# Patient Record
Sex: Female | Born: 1975 | Race: White | Hispanic: No | Marital: Married | Smoking: Former smoker
Health system: Southern US, Community
[De-identification: ages and names within clinical notes are randomized; demographics above are authoritative.]

## PROBLEM LIST (undated history)

## (undated) DIAGNOSIS — T7840XA Allergy, unspecified, initial encounter: Secondary | ICD-10-CM

## (undated) DIAGNOSIS — R002 Palpitations: Secondary | ICD-10-CM

## (undated) DIAGNOSIS — F329 Major depressive disorder, single episode, unspecified: Secondary | ICD-10-CM

## (undated) DIAGNOSIS — R001 Bradycardia, unspecified: Secondary | ICD-10-CM

## (undated) DIAGNOSIS — F32A Depression, unspecified: Secondary | ICD-10-CM

## (undated) HISTORY — PX: TONSILLECTOMY: SUR1361

## (undated) HISTORY — DX: Bradycardia, unspecified: R00.1

## (undated) HISTORY — DX: Allergy, unspecified, initial encounter: T78.40XA

## (undated) HISTORY — DX: Depression, unspecified: F32.A

## (undated) HISTORY — DX: Palpitations: R00.2

## (undated) HISTORY — DX: Major depressive disorder, single episode, unspecified: F32.9

---

## 2007-11-21 LAB — CONVERTED CEMR LAB: Pap Smear: NORMAL

## 2008-02-24 ENCOUNTER — Encounter (INDEPENDENT_AMBULATORY_CARE_PROVIDER_SITE_OTHER): Payer: Self-pay | Admitting: Obstetrics & Gynecology

## 2008-02-24 ENCOUNTER — Inpatient Hospital Stay (HOSPITAL_COMMUNITY): Admission: AD | Admit: 2008-02-24 | Discharge: 2008-02-24 | Payer: Self-pay | Admitting: Family Medicine

## 2008-09-10 ENCOUNTER — Ambulatory Visit: Payer: Self-pay | Admitting: Internal Medicine

## 2008-09-10 DIAGNOSIS — R002 Palpitations: Secondary | ICD-10-CM

## 2008-09-10 LAB — CONVERTED CEMR LAB
ALT: 15 units/L (ref 0–35)
Basophils Relative: 1.6 % (ref 0.0–3.0)
Bilirubin, Direct: 0.1 mg/dL (ref 0.0–0.3)
CO2: 28 meq/L (ref 19–32)
Calcium: 8.9 mg/dL (ref 8.4–10.5)
Creatinine, Ser: 0.5 mg/dL (ref 0.4–1.2)
Free T4: 0.6 ng/dL (ref 0.6–1.6)
Glucose, Bld: 55 mg/dL — ABNORMAL LOW (ref 70–99)
Hemoglobin: 13.1 g/dL (ref 12.0–15.0)
Lymphocytes Relative: 10 % — ABNORMAL LOW (ref 12.0–46.0)
Monocytes Relative: 1.2 % — ABNORMAL LOW (ref 3.0–12.0)
Neutro Abs: 9.3 10*3/uL — ABNORMAL HIGH (ref 1.4–7.7)
RBC: 3.97 M/uL (ref 3.87–5.11)
TSH: 2.22 microintl units/mL (ref 0.35–5.50)
Total Protein: 6.9 g/dL (ref 6.0–8.3)

## 2008-09-14 ENCOUNTER — Encounter: Payer: Self-pay | Admitting: Internal Medicine

## 2008-09-17 ENCOUNTER — Encounter: Payer: Self-pay | Admitting: Internal Medicine

## 2008-09-17 ENCOUNTER — Telehealth: Payer: Self-pay | Admitting: Internal Medicine

## 2008-09-17 ENCOUNTER — Ambulatory Visit: Payer: Self-pay

## 2008-10-02 ENCOUNTER — Telehealth: Payer: Self-pay | Admitting: Internal Medicine

## 2009-01-29 ENCOUNTER — Inpatient Hospital Stay (HOSPITAL_COMMUNITY): Admission: AD | Admit: 2009-01-29 | Discharge: 2009-01-30 | Payer: Self-pay | Admitting: Cardiology

## 2009-01-30 ENCOUNTER — Inpatient Hospital Stay (HOSPITAL_COMMUNITY): Admission: AD | Admit: 2009-01-30 | Discharge: 2009-02-01 | Payer: Self-pay | Admitting: Obstetrics and Gynecology

## 2009-07-05 ENCOUNTER — Telehealth: Payer: Self-pay | Admitting: Internal Medicine

## 2009-08-08 ENCOUNTER — Ambulatory Visit: Payer: Self-pay | Admitting: Internal Medicine

## 2009-10-14 ENCOUNTER — Ambulatory Visit: Payer: Self-pay | Admitting: Internal Medicine

## 2009-10-23 ENCOUNTER — Ambulatory Visit: Payer: Self-pay | Admitting: Interventional Radiology

## 2009-10-23 ENCOUNTER — Ambulatory Visit (HOSPITAL_BASED_OUTPATIENT_CLINIC_OR_DEPARTMENT_OTHER): Admission: RE | Admit: 2009-10-23 | Discharge: 2009-10-23 | Payer: Self-pay | Admitting: Internal Medicine

## 2009-10-24 ENCOUNTER — Telehealth: Payer: Self-pay | Admitting: Internal Medicine

## 2009-12-10 ENCOUNTER — Encounter: Payer: Self-pay | Admitting: Internal Medicine

## 2009-12-20 ENCOUNTER — Ambulatory Visit: Payer: Self-pay | Admitting: Internal Medicine

## 2010-01-13 ENCOUNTER — Ambulatory Visit: Payer: Self-pay | Admitting: Family

## 2010-01-13 LAB — CONVERTED CEMR LAB
Basophils Absolute: 0 10*3/uL (ref 0.0–0.1)
CO2: 30 meq/L (ref 19–32)
Calcium: 9.3 mg/dL (ref 8.4–10.5)
Eosinophils Absolute: 0.1 10*3/uL (ref 0.0–0.7)
GFR calc non Af Amer: 103.41 mL/min (ref >60)
HCT: 40.7 % (ref 36.0–46.0)
Lymphs Abs: 1.5 10*3/uL (ref 0.7–4.0)
MCHC: 34.4 g/dL (ref 30.0–36.0)
Monocytes Relative: 6.7 % (ref 3.0–12.0)
Platelets: 287 10*3/uL (ref 150.0–400.0)
RDW: 12.6 % (ref 11.5–14.6)
Sodium: 140 meq/L (ref 135–145)

## 2010-02-21 ENCOUNTER — Ambulatory Visit: Payer: Self-pay | Admitting: Family

## 2010-02-21 DIAGNOSIS — F40243 Fear of flying: Secondary | ICD-10-CM

## 2010-04-01 LAB — CONVERTED CEMR LAB: Pap Smear: NORMAL

## 2010-06-02 ENCOUNTER — Telehealth: Payer: Self-pay | Admitting: Internal Medicine

## 2010-06-06 ENCOUNTER — Ambulatory Visit: Payer: Self-pay | Admitting: Internal Medicine

## 2010-06-09 ENCOUNTER — Telehealth: Payer: Self-pay | Admitting: Internal Medicine

## 2010-06-13 ENCOUNTER — Telehealth: Payer: Self-pay | Admitting: Internal Medicine

## 2010-06-14 ENCOUNTER — Ambulatory Visit: Payer: Self-pay | Admitting: Family Medicine

## 2010-07-05 ENCOUNTER — Ambulatory Visit: Payer: Self-pay | Admitting: Internal Medicine

## 2010-07-09 ENCOUNTER — Ambulatory Visit: Payer: Self-pay | Admitting: Family

## 2010-09-26 ENCOUNTER — Encounter: Payer: Self-pay | Admitting: Internal Medicine

## 2010-09-26 ENCOUNTER — Ambulatory Visit: Payer: Self-pay | Admitting: Family

## 2010-09-26 DIAGNOSIS — J019 Acute sinusitis, unspecified: Secondary | ICD-10-CM | POA: Insufficient documentation

## 2010-09-26 LAB — CONVERTED CEMR LAB
Cholesterol: 175 mg/dL (ref 0–200)
Triglycerides: 66 mg/dL (ref <150)

## 2010-09-28 ENCOUNTER — Encounter: Payer: Self-pay | Admitting: Internal Medicine

## 2010-12-16 ENCOUNTER — Ambulatory Visit (INDEPENDENT_AMBULATORY_CARE_PROVIDER_SITE_OTHER)
Admission: RE | Admit: 2010-12-16 | Discharge: 2010-12-16 | Payer: PRIVATE HEALTH INSURANCE | Source: Home / Self Care | Attending: Internal Medicine | Admitting: Internal Medicine

## 2010-12-16 DIAGNOSIS — F341 Dysthymic disorder: Secondary | ICD-10-CM | POA: Insufficient documentation

## 2010-12-16 DIAGNOSIS — M25569 Pain in unspecified knee: Secondary | ICD-10-CM | POA: Insufficient documentation

## 2010-12-16 LAB — CONVERTED CEMR LAB
BUN: 18 mg/dL (ref 6–23)
Creatinine, Ser: 0.85 mg/dL (ref 0.40–1.20)
Free T4: 1.16 ng/dL (ref 0.80–1.80)
Glucose, Bld: 82 mg/dL (ref 70–99)
HCT: 40.5 % (ref 36.0–46.0)
Hemoglobin: 13.6 g/dL (ref 12.0–15.0)
MCHC: 33.6 g/dL (ref 30.0–36.0)
MCV: 90.8 fL (ref 78.0–100.0)
RBC: 4.46 M/uL (ref 3.87–5.11)
RDW: 12.7 % (ref 11.5–15.5)
TSH: 2.447 microintl units/mL (ref 0.350–4.500)

## 2010-12-16 NOTE — Assessment & Plan Note (Signed)
Summary: PINK EYE AND POSS EAR INFECTION...AS.   Vital Signs:  Patient profile:   35 year old female Weight:      167 pounds Temp:     98.3 degrees F oral Pulse rate:   72 / minute Pulse rhythm:   regular BP sitting:   100 / 58  (right arm) Cuff size:   regular  Vitals Entered By: Lowella Petties CMA (July 05, 2010 10:11 AM) CC: Itchy eyes, sore throat, ears hurting   History of Present Illness: Having some redness in eyes for 2-3 days son has conjjuctivits also given eye Rx but may be virus had left over med (cipro ointment) and started using that  Having ear and throat pain since last night can feel fluid in her ears throat is sore may have had fever---woke up in sweat  No sig cough No SOB   Allergies: 1)  ! Erythromycin  Past History:  Past medical, surgical, family and social histories (including risk factors) reviewed for relevance to current acute and chronic problems.  Past Medical History: Reviewed history from 06/06/2010 and no changes required. Depression / dysthymia        Family History: Reviewed history from 06/06/2010 and no changes required. no family hx of aneurysm no fam hx of polycystic kidney dz.     Social History: Reviewed history from 06/06/2010 and no changes required. 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  NAD Eyes:  tarsal > bulbar conjunctival injection more on right No foreign body Ears:  R ear normal and L ear normal.   Nose:  moderate inflammation and mild swelling Mouth:  no erythema, no exudates, and no lesions.   Neck:  supple, no masses, and no cervical lymphadenopathy.   Lungs:  normal respiratory effort, no intercostal retractions, no accessory muscle use, and normal breath sounds.      Impression & Recommendations:  Problem # 1:  CONJUNCTIVITIS (ICD-372.30) Assessment New  has had some crusting  sounds like she has a general viral syndrome but not sure about the eyes will use general supportive measures  sulamyd drops for eyes  Her updated medication list for this problem includes:    Sulfacetamide Sodium 10 % Soln (Sulfacetamide sodium) .Marland Kitchen... 2 drops in each four times daily for 5 days for pink eye  Complete Medication List: 1)  Sertraline Hcl 50 Mg Tabs (Sertraline hcl) .... Take 1 tablet by mouth once a day 2)  Alprazolam 0.5 Mg Tabs (Alprazolam) .... One tablet by mouth prior to flying 3)  Sulfacetamide Sodium 10 % Soln (Sulfacetamide sodium) .... 2 drops in each four times daily for 5 days for pink eye  Patient Instructions: 1)  Please schedule a follow-up appointment as needed .  Prescriptions: SULFACETAMIDE SODIUM 10 % SOLN (SULFACETAMIDE SODIUM) 2 drops in each four times daily for 5 days for pink eye  #1 x 0   Entered and Authorized by:   Cindee Salt MD   Signed by:   Cindee Salt MD on 07/05/2010   Method used:   [id number]       [address]  [place], [place]  [address]       Ph: [phone] or [phone]       Fax: [phone]   RxID:   [id number]   Prior Medications: SERTRALINE HCL 50 MG TABS (SERTRALINE HCL) Take 1 tablet by mouth once a day ALPRAZOLAM 0.5 MG TABS (ALPRAZOLAM) one tablet by mouth prior to flying Current Allergies: ! ERYTHROMYCIN

## 2010-12-16 NOTE — Assessment & Plan Note (Signed)
Summary: poss sinus infection/dt   Vital Signs:  Patient profile:   35 year old female Height:      64 inches Weight:      166.50 pounds BMI:     28.68 O2 Sat:      98 % on Room air Temp:     98.1 degrees F oral Pulse rate:   59 / minute Resp:     18 per minute BP sitting:   80 / 50  (right arm) Cuff size:   regular  Vitals Entered By: Glendell Docker CMA (September 26, 2010 8:59 AM)  O2 Flow:  Room air CC: Head congestion Is Patient Diabetic? No Pain Assessment Patient in pain? no      Comments c/o sinus pressure, dore throat, nasal draingage green in color, headache. onset Monday. Tylenol with relief of headache,saline rinse little improvement, wouldl like a rx for xanax to help with upcoming flight, would cholesterol level checked today,  fasting for labs   Primary Care Provider:  D. Thomos Lemons DO  CC:  Head congestion.  History of Present Illness: Autumn [name] is a 35 year old female who presents with a 5 day history of nasal congestion with green nasal discharge.  Denies fever, feels exhausted.  Bad sinus pressure, can't even wear her glasses.  + sore throat and bilateral ear discomfort.    Wants  her cholesterol checked.    Requesting refill on alprazolam-  Flying for thanksgiving.  Helps her fly.  f/u BP was 102/70 today in the office.  Initial BP performed by CMA was low.  Preventive Screening-Counseling & Management  Alcohol-Tobacco     Smoking Status: quit  Allergies: 1)  ! Erythromycin  Past History:  Past Medical History: Last updated: 06/06/2010 Depression / dysthymia        Review of Systems       see HPI  Physical Exam  General:  Well-developed,well-nourished,in no acute distress; alert,appropriate and cooperative throughout examination Head:  + maxillary and frontal sinus pressure Eyes:  PERRLA, sclera are clear Ears:  External ear exam shows no significant lesions or deformities.  Otoscopic examination reveals clear canals, tympanic  membranes are intact bilaterally without bulging, retraction, inflammation or discharge. Hearing is grossly normal bilaterally. Mouth:  Oral mucosa and oropharynx without lesions or exudates.  Teeth in good repair. Neck:  No deformities, masses, or tenderness noted. Lungs:  Normal respiratory effort, chest expands symmetrically. Lungs are clear to auscultation, no crackles or wheezes. Heart:  Normal rate and regular rhythm. S1 and S2 normal without gallop, murmur, click, rub or other extra sounds.   Impression & Recommendations:  Problem # 1:  SINUSITIS, ACUTE (ICD-461.9) Assessment New will treat with amoxicillin.  Her updated medication list for this problem includes:    Amoxicillin 500 Mg Caps (Amoxicillin) .Marland Kitchen... 2 caps by mouth 3  times a day x 10 days  Problem # 2:  PHOBIA, UNSPECIFIED (ICD-300.20)  Alprazolam was refilled for her upcoming flight.    Complete Medication List: 1)  Sertraline Hcl 50 Mg Tabs (Sertraline hcl) .... Take 1 tablet by mouth once a day 2)  Alprazolam 0.5 Mg Tabs (Alprazolam) .... One tablet by mouth prior to flying 3)  Amoxicillin 500 Mg Caps (Amoxicillin) .... 2 caps by mouth 3  times a day x 10 days  Other Orders: TLB-Lipid Panel (80061-LIPID)  Patient Instructions: 1)   Call if you develop fever over 101, increasing sinus pressure, pain with eye movement, increased facial tenderness of  swelling, or if you develop visual changes. 2)  Please complete your blood work on the first floor.  Prescriptions: ALPRAZOLAM 0.5 MG TABS (ALPRAZOLAM) one tablet by mouth prior to flying  #5 x 0   Entered and Authorized by:   Lemont Fillers FNP   Signed by:   Lemont Fillers FNP on 09/26/2010   Method used:   Print then Give to Patient   RxID:   [id number] AMOXICILLIN 500 MG CAPS (AMOXICILLIN) 2 caps by mouth 3  times a day x 10 days  #60 x 0   Entered and Authorized by:   Lemont Fillers FNP   Signed by:   Lemont Fillers FNP on  09/26/2010   Method used:   Print then Give to Patient   RxID:   [id number]    Orders Added: 1)  TLB-Lipid Panel [80061-LIPID] 2)  Est. Patient Level III [47425]     Current Allergies (reviewed today): ! ERYTHROMYCIN

## 2010-12-16 NOTE — Assessment & Plan Note (Signed)
Summary: ACUTE--EAR INF//VGJ   Vital Signs:  Patient profile:   35 year old female Weight:      169 pounds Temp:     98.2 degrees F oral Pulse rate:   76 / minute Pulse rhythm:   regular BP sitting:   100 / 70  (right arm) Cuff size:   regular  Vitals Entered By: Lowella Petties CMA (June 14, 2010 9:09 AM) CC: Both ears hurting, has cough and sore throat.  Taking abx.   Primary Care Provider:  Dondra Spry DO   History of Present Illness: pleasant 35 year old female presents with being sick this week, she is traveling and flying to [place] this week, she wanted to have her ears recheck. They're still feeling full and having some pain, and some sinus congestion.  She's currently on  Antibiotics. Afebrile.  REVIEW OF SYSTEMS GEN: Acute illness details above. CV: No chest pain or SOB GI: No noted N or V Otherwise, pertinent positives and negatives are noted in the HPI.   GEN: WDWN, NAD; alert,appropriate and cooperative throughout exam HEENT: Normocephalic and atraumatic. Throat clear, w/o exudate, no LAD, R TM clear, L TM - good landmarks, serous fluid present. rhinnorhea.  Left frontal and maxillary sinuses: NT Right frontal and maxillary sinuses: NT NECK: No ant or post LAD CV: RRR, No M/G/R PULM: no resp distress, no accessory muscles.  No retractions. no w/c/r ABD: S,NT,ND,+BS, No HSM EXTR: no c/c/e PSYCH: full affect, pleasant, conversant   Allergies: 1)  ! Erythromycin   Impression & Recommendations:  Problem # 1:  OTITIS MEDIA, SEROUS, ACUTE (ICD-381.01) Assessment Improved at this point, ears appear sterile, there is serous fluid present, but discussed symptomatic measures with the patient including Sudafed use, afrin, and reassured  Complete Medication List: 1)  Sertraline Hcl 50 Mg Tabs (Sertraline hcl) .... Take 1 tablet by mouth once a day 2)  Alprazolam 0.5 Mg Tabs (Alprazolam) .... One tablet by mouth prior to flying 3)  Cefuroxime Axetil 500 Mg  Tabs (Cefuroxime axetil) .... One by mouth two times a day 4)  Prednisone 20 Mg Tabs (Prednisone) .Marland Kitchen.. 1 tab by mouth two times a day x 4 days, then 1/2 by mouth two times a day x 4 days, then 1/2 by mouth once daily x 4 days

## 2010-12-16 NOTE — Letter (Signed)
   [place] [facility] [address] [place], [place]  [address]  [place] Phone: [phone]      September 28, 2010   [facility] [address] [place], [place] [address]  RE:  LAB RESULTS  Dear  [name],  The following is an interpretation of your most recent lab tests.  Please take note of any instructions provided or changes to medications that have resulted from your lab work.  LIPID PANEL:  Good - no changes needed Triglyceride: 66   Cholesterol: 175   LDL: 93   HDL: 69   Chol/HDL%:  2.5 Ratio        Sincerely Yours,    Dr. Thomos Lemons  Appended Document:  mailed

## 2010-12-16 NOTE — Progress Notes (Signed)
Summary: needs rx to be able to fly   Phone Note Call from Patient Call back at Work Phone [phone]   Caller: patient live Call For: Autumn [name]  Summary of Call: She has a flight to [place] and needs something for her flying anxiety.  [facility] [address]  Initial call taken by: Roselle Locus,  June 02, 2010 1:21 PM  Follow-up for Phone Call        see rx for alprazolam Follow-up by: D. Thomos Lemons DO,  June 02, 2010 3:12 PM  Additional Follow-up for Phone Call Additional follow up Details #1::        Rx Called In, patient informed  Additional Follow-up by: Glendell Docker CMA,  June 02, 2010 4:34 PM    Prescriptions: ALPRAZOLAM 0.5 MG TABS (ALPRAZOLAM) one tablet by mouth prior to flying  #10 x 0   Entered and Authorized by:   D. Thomos Lemons DO   Signed by:   D. Thomos Lemons DO on 06/02/2010   Method used:   Telephoned to ...       [facility]  [address] [phone]       [address]       [place], [place]  [address]       Ph: [phone] or [phone]       Fax: [phone]   RxID:   [id number]

## 2010-12-16 NOTE — Progress Notes (Signed)
Summary: Status Update  Phone Note Call from Patient Call back at Home Phone [phone]   Caller: Patient Call For: D. Thomos Lemons DO Summary of Call: patient called and left voice message stating she was seen on Friday, her ear pain has improved some, but she now has a cough, and feels bad. She would like to know if she should give the antibiotics a couple more days to work or should she come back to be evaluated Initial call taken by: Glendell Docker CMA,  June 09, 2010 11:31 AM  Follow-up for Phone Call        give it a few more days,  if not feeling better, OV Follow-up by: D. Thomos Lemons DO,  June 09, 2010 1:49 PM  Additional Follow-up for Phone Call Additional follow up Details #1::        Pt advised per Dr. Olegario Messier instructions. Nicki Guadalajara Fergerson CMA Duncan Dull)  June 09, 2010 3:22 PM

## 2010-12-16 NOTE — Assessment & Plan Note (Signed)
Summary: anxiety about a trip/mhf   Vital Signs:  Patient profile:   34 year old female Height:      64 inches Weight:      165 pounds BMI:     28.42 Temp:     98.1 degrees F oral Pulse rate:   84 / minute Pulse rhythm:   regular Resp:     16 per minute BP sitting:   108 / 60  (right arm) Cuff size:   regular  Vitals Entered By: Mervin Kung CMA (February 21, 2010 1:18 PM) CC: room 5  Pt. states she is anxious about flying. Would like something to help calm her nerves.   Primary Care Provider:  DThomos Lemons DO  CC:  room 5  Pt. states she is anxious about flying. Would like something to help calm her nerves..  History of Present Illness: Ms Hazelett is a 35 year old female who presents today with anxiety regarding her upcoming flight to [place].  She is flying directly from [place] to [facility].  She continues sertraline which she feels controls her generalized anxiety, however the last two days she has become increasingly anxious about her trip which is this evening.  She is requesting something to "calm her nerves" on the airplane.    Allergies: 1)  ! Erythromycin  Physical Exam  General:  Well-developed,well-nourished,in no acute distress; alert,appropriate and cooperative throughout examination Psych:  Cognition and judgment appear intact. Alert and cooperative with normal attention span and concentration. No apparent delusions, illusions, hallucinations   Impression & Recommendations:  Problem # 1:  PHOBIA, UNSPECIFIED (ICD-300.20) Assessment Comment Only 15 minutes spent with patient.  Greater than 50% of this time was spent counselling patient on her anxiety and fear of flying. A prescription was provided for alprazolam to be used prior to flying.    Complete Medication List: 1)  Sertraline Hcl 50 Mg Tabs (Sertraline hcl) .... Take 1 tablet by mouth once a day 2)  Alprazolam 0.5 Mg Tabs (Alprazolam) .... One tablet by mouth prior to flying  Patient  Instructions: 1)  Have a fun trip! 2)  Follow up as needed. Prescriptions: ALPRAZOLAM 0.5 MG TABS (ALPRAZOLAM) one tablet by mouth prior to flying  #4 x 0   Entered and Authorized by:   Lemont Fillers FNP   Signed by:   Lemont Fillers FNP on 02/21/2010   Method used:   Print then Give to Patient   RxID:   [id number]   Current Allergies (reviewed today): ! ERYTHROMYCIN

## 2010-12-16 NOTE — Assessment & Plan Note (Signed)
Summary: sinus infection/mhf   Vital Signs:  Patient profile:   35 year old female Height:      64 inches Weight:      172 pounds BMI:     29.63 O2 Sat:      96 % on Room air Temp:     97.9 degrees F oral Pulse rate:   62 / minute Pulse rhythm:   regular Resp:     16 per minute BP sitting:   90 / 60  (left arm) Cuff size:   regular  Vitals Entered By: Glendell Docker CMA (June 06, 2010 4:20 PM)  O2 Flow:  Room air CC: Sinus  Infection Is Patient Diabetic? No Pain Assessment Patient in pain? no        Primary Care Provider:  Dondra Spry DO  CC:  Sinus  Infection.  History of Present Illness: 35 y/o white female co bilateral ear pain and fluid build up, nasal draingage green in color, for the past 5 days. Nyquil with little relief. She denies temp  Preventive Screening-Counseling & Management  Alcohol-Tobacco     Smoking Status: quit  Allergies: 1)  ! Erythromycin  Past History:  Past Medical History: Depression / dysthymia        Family History: no family hx of aneurysm no fam hx of polycystic kidney dz.     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   Ears:  right and left tm red and retracted.  bilateral fluid behind TM Mouth:  pharyngeal erythema.   Lungs:  Normal respiratory effort, chest expands symmetrically. Lungs are clear to auscultation, no crackles or wheezes. Heart:  Normal rate and regular rhythm. S1 and S2 normal without gallop, murmur, click, rub or other extra sounds.   Impression & Recommendations:  Problem # 1:  OTITIS MEDIA, SEROUS, ACUTE (ICD-381.01) take ceftin and prednisone as directed Patient advised to call office if symptoms persist or worsen.  Complete Medication List: 1)  Sertraline Hcl 50 Mg Tabs (Sertraline  hcl) .... Take 1 tablet by mouth once a day 2)  Alprazolam 0.5 Mg Tabs (Alprazolam) .... One tablet by mouth prior to flying 3)  Cefuroxime Axetil 500 Mg Tabs (Cefuroxime axetil) .... One by mouth two times a day 4)  Prednisone 20 Mg Tabs (Prednisone) .Marland Kitchen.. 1 tab by mouth two times a day x 4 days, then 1/2 by mouth two times a day x 4 days, then 1/2 by mouth once daily x 4 days  Patient Instructions: 1)  Call our office if your symptoms do not  improve or gets worse. Prescriptions: PREDNISONE 20 MG TABS (PREDNISONE) 1 tab by mouth two times a day x 4 days, then 1/2 by mouth two times a day x 4 days, then 1/2 by mouth once daily x 4 days  #14 x 0   Entered and Authorized by:   D. Thomos Lemons DO   Signed by:   D. Thomos Lemons DO on 06/06/2010   Method used:   [facility]        [id number]       [address]       [place], [place]  [address]  Ph: [phone] or [phone]       Fax: [phone]   RxID:   [id number] CEFUROXIME AXETIL 500 MG TABS (CEFUROXIME AXETIL) one by mouth two times a day  #20 x 0   Entered and Authorized by:   D. Thomos Lemons DO   Signed by:   D. Thomos Lemons DO on 06/06/2010   Method used:   [facility]ically [facility]        [id number]       [address]       [place], [place]  [address]       Ph: [phone] or [phone]       Fax: [phone]   RxID:   [id number]   Current Allergies (reviewed today): ! ERYTHROMYCIN   Preventive Care Screening  Pap Smear:    Date:  04/01/2010    Results:  normal

## 2010-12-16 NOTE — Assessment & Plan Note (Signed)
Summary: pink eye / tf,cma--Rm 4   Vital Signs:  Patient profile:   35 year old female Height:      64 inches Weight:      167.50 pounds BMI:     28.86 Temp:     98.0 degrees F oral Pulse rate:   72 / minute Pulse rhythm:   regular Resp:     16 per minute BP sitting:   100 / 74  (right arm) Cuff size:   regular  Vitals Entered By: Mervin Kung CMA Duncan Dull) (July 09, 2010 3:50 PM) CC: Room 4   Pt seen at Saturday [facility] and given rx for pink eye. Symptoms not better. Is Patient Diabetic? No   Primary Care Provider:  Dondra Spry DO  CC:  Room 4   Pt seen at [facility] clinic and given rx for pink eye. Symptoms not better.Marland Kitchen  History of Present Illness: Ms Humbarger is a 35 year old female who presents today with complaint of pink eye. These symptoms started last thursday. Notes that her son also had conjunctivitis, but this cleared on its own without antibiotics.  She saw Dr Alphonsus Sias in the Saturday clinic and was given Rx for sulfacetamide drops.  Initially felt better on sunday, but then got bad again on Sunday.  Initially symptoms started in the right eye, now more severe in left eye.  Denies visual deficits.  No definite fever, but had chills on Friday night.  Mild nasal congestion in AM, then better in the afternoons.  Notes that eyes are "glued shut" in the AM with thick yellow discharge.   Allergies: 1)  ! Erythromycin  Past History:  Past Medical History: Last updated: 06/06/2010 Depression / dysthymia        Physical Exam  General:  Well-developed,well-nourished,in no acute distress; alert,appropriate and cooperative throughout examination Head:  Normocephalic and atraumatic without obvious abnormalities. No apparent alopecia or balding. Eyes:  vision grossly intact, +conjunctival injection bilaterally left greater than right.  No discharge or crusting noted on eyelashes. Lungs:  Normal respiratory effort, chest expands symmetrically. Lungs are clear to auscultation,  no crackles or wheezes. Heart:  Normal rate and regular rhythm. S1 and S2 normal without gallop, murmur, click, rub or other extra sounds.   Impression & Recommendations:  Problem # 1:  CONJUNCTIVITIS (ICD-372.30) Assessment Unchanged Will D/C sulfacetamide solution.  Start Ciloxan.  Etiology may be viral vs. allergic conjunctivitis.  If no improvement in next few days or if symptoms worsen will consider referral to opthalmology.  Plan discussed with pt.   Her updated medication list for this problem includes:    Sulfacetamide Sodium 10 % Soln (Sulfacetamide sodium) .Marland Kitchen... 2 drops in each four times daily for 5 days for pink eye    Ciloxan 0.3 % Soln (Ciprofloxacin hcl) .Marland Kitchen... 2 drops in each ey every 2 hours while awake x 2 days, then every 4 hours while awake for additional 5 days  Complete Medication List: 1)  Sertraline Hcl 50 Mg Tabs (Sertraline hcl) .... Take 1 tablet by mouth once a day 2)  Alprazolam 0.5 Mg Tabs (Alprazolam) .... One tablet by mouth prior to flying 3)  Sulfacetamide Sodium 10 % Soln (Sulfacetamide sodium) .... 2 drops in each four times daily for 5 days for pink eye 4)  Ciloxan 0.3 % Soln (Ciprofloxacin hcl) .... 2 drops in each ey every 2 hours while awake x 2 days, then every 4 hours while awake for additional 5 days  Patient Instructions:  1)  Go to ER if you develop problems with your vision. 2)  Call if symptoms worsen or do not improve. Prescriptions: CILOXAN 0.3 % SOLN (CIPROFLOXACIN HCL) 2 drops in each ey every 2 hours while awake x 2 days, then every 4 hours while awake for additional 5 days  #1 x 0   Entered and Authorized by:   Lemont Fillers FNP   Signed by:   Lemont Fillers FNP on 07/09/2010   Method used:   [facility]        [facility]  [address] [phone][facility]       [address] [place], [place]  [address]       Ph: [phone] or [phone]       Fax: [phone]   RxID:   [id number]   Current Allergies  (reviewed today): ! ERYTHROMYCIN

## 2010-12-16 NOTE — Letter (Signed)
   [facility] [address] [place], [place] [address]    January 13, 2010   [facility] [address] [place], [place] [address]  RE:  LAB RESULTS  Dear  Ms. Bolen,  The following is an interpretation of your most recent lab tests.  Please take note of any instructions provided or changes to medications that have resulted from your lab work.  ELECTROLYTES:  Good - no changes needed   CBC:  Good - no changes needed   Sincerely Yours,    Lemont Fillers FNP

## 2010-12-16 NOTE — Assessment & Plan Note (Signed)
Summary: Diareah- jr   Vital Signs:  Patient profile:   35 year old female Weight:      164.50 pounds BMI:     28.34 Temp:     98.2 degrees F oral Pulse rate:   76 / minute Pulse rhythm:   regular Resp:     18 per minute BP sitting:   100 / 78  (left arm) Cuff size:   regular  Vitals Entered By: Mervin Kung CMA (January 13, 2010 10:38 AM) CC: room 16  Diarrhea Comments Diarrhea since last Tuesday after n/v on Monday. N/V improved.    Primary Care Provider:  Dondra Spry DO  CC:  room 16  Diarrhea.  History of Present Illness: Autumn [name] is a 35 year old female who presents today with c/o diarrhea x 6 days.  She had nausea and vomitting on Monday 2/21 which has now improved.  Notes that she started Kaopectate yesterday previously 6-8 loose BM's a day.  Now after koapectate she is down to 3-4/day.  Patient denies fever.  Denies bloody stool.  Note's stools are darker after immodium.  Allergies: 1)  ! Erythromycin  Physical Exam  General:  Well-developed,well-nourished,in no acute distress; alert,appropriate and cooperative throughout examination Head:  Normocephalic and atraumatic without obvious abnormalities. No apparent alopecia or balding. Neck:  No deformities, masses, or tenderness noted. Lungs:  Normal respiratory effort, chest expands symmetrically. Lungs are clear to auscultation, no crackles or wheezes. Heart:  Normal rate and regular rhythm. S1 and S2 normal without gallop, murmur, click, rub or other extra sounds. Abdomen:  Bowel sounds positive,abdomen soft.  Very mild LLQ tenderness without guarding or masses, organomegaly or hernias noted. No acute abdomen.     Impression & Recommendations:  Problem # 1:  DIARRHEA, ACUTE (ICD-787.91) Assessment New Given recent history of antibiotics will check stool for C. Diff.  Will also check CBC and stool culture.  BMET to assess hydration and electrolytes.  Patient is nursing and this limits our medication  choices.   Her updated medication list for this problem includes:    Kaopectate 262 Mg/32ml Susp (Bismuth subsalicylate) .Marland Kitchen... 2 tbsp as needed  Orders: Venipuncture (25427) TLB-CBC Platelet - w/Differential (85025-CBCD) TLB-BMP (Basic Metabolic Panel-BMET) (80048-METABOL) T-Clostridium difficile Toxin A/B (06237-62831) T-Culture, Stool (87045/[id number])  Complete Medication List: 1)  Sertraline Hcl 50 Mg Tabs (Sertraline hcl) .... Take 1 tablet by mouth once a day 2)  Kaopectate 262 Mg/58ml Susp (Bismuth subsalicylate) .... 2 tbsp as needed   Patient Instructions: 1)  Please follow up in 1 week, sooner if fever, abdominal pain, worsening diarrhea or inability to keep down food or medicine. 2)  Complete stool samples and bring back ASAP.  Current Allergies (reviewed today): ! ERYTHROMYCIN

## 2010-12-16 NOTE — Progress Notes (Signed)
Summary: No Improvement  Phone Note Call from Patient Call back at Work Phone [phone]   Caller: Patient Call For: D. Thomos Lemons DO Summary of Call: patient called and left voice message at 4:45pm stating she was not feeling any better.  Call was returned to patient, she was advised per Dr Artist Pais that if she was not feeling any better, she would need to return for an office visit. She was offered to schedule with Dr Artist Pais next week, she declines and states she will be out of town next week. She was informed of Saturday Clinic at [facility] and was advised to call [phone] phone number to schedule. Initial call taken by: Glendell Docker CMA,  June 13, 2010 4:52 PM

## 2010-12-16 NOTE — Assessment & Plan Note (Signed)
Summary: congestion/mhf   Vital Signs:  Patient profile:   35 year old female Weight:      167.25 pounds BMI:     28.81 O2 Sat:      97 % on Room air Temp:     97.9 degrees F oral Pulse rate:   63 / minute Pulse rhythm:   regular Resp:     18 per minute BP sitting:   108 / 60  (right arm) Cuff size:   regular  Vitals Entered By: Glendell Docker CMA (December 20, 2009 9:12 AM)  O2 Flow:  Room air  Primary Care Provider:  D. Thomos Lemons DO  CC:  URI symptoms.  History of Present Illness:  URI Symptoms      This is a 35 year old woman who presents with URI symptoms.  The patient reports purulent nasal discharge, sore throat, productive cough, earache, and sick contacts.  The patient denies fever, stiff neck, dyspnea, wheezing, rash, and vomiting.  The patient also reports itchy watery eyes and headache.  Son has RSV.  she  notes sinus congestion and drainage x 2 weeks.  eye discharge in AM. right ear feels full and clicks  Allergies: 1)  ! Erythromycin  Past History:  Past Medical History: Depression / dysthymia       Family History: no family hx of aneurysm no fam hx of polycystic kidney dz.    Social History: Occupation:  Museum/gallery exhibitions officer for [employer] Married for 3 yrs Alcohol use-no   Previously a light smoker - Quit in 1630   28 month old infant  Physical Exam  General:  alert, well-developed, and well-nourished.   Eyes:  pupils equal, pupils round, and pupils reactive to light.  mild conjunctival injection Ears:  left TM retracted - fluid level Lungs:  normal respiratory effort and normal breath sounds.   Heart:  normal rate, regular rhythm, and no gallop.     Impression & Recommendations:  Problem # 1:  OTITIS MEDIA, SEROUS, ACUTE (ICD-381.01) Take abx and prednisone as directed.  Patient advised to call office if symptoms persist or worsen.  Complete Medication List: 1)  Multivitamins Tabs (Multiple vitamin) .... Take 1 tablet by  mouth once a day 2)  Sertraline Hcl 50 Mg Tabs (Sertraline hcl) .... Take 1 tablet by mouth once a day 3)  Amoxicillin 875 Mg Tabs (Amoxicillin) .... One by mouth two times a day 4)  Prednisone 10 Mg Tabs (Prednisone) .... 3 tabs by mouth once daily x 3 days, 2 tabs by mouth once daily x 3 days, 1 tab by mouth once daily x 3 days  Patient Instructions: 1)  Call our office if your symptoms do not  improve or gets worse. Prescriptions: PREDNISONE 10 MG TABS (PREDNISONE) 3 tabs by mouth once daily x 3 days, 2 tabs by mouth once daily x 3 days, 1 tab by mouth once daily x 3 days  #18 x 0   Entered and Authorized by:   D. Thomos Lemons DO   Signed by:   D. Thomos Lemons DO on 12/20/2009   Method used:   Electronically to        [id number] (ret[facility])       [address]       [place], [place]  [address]       Ph: [phone] or [phone]       Fax:  [phone]   RxID:   [id number] AMOXICILLIN 875 MG TABS (AMOXICILLIN) one by mouth two times a day  #20 x 0   Entered and Authorized by:   D. Thomos Lemons DO   Signed by:   D. Thomos Lemons DO on 12/20/2009   Method used:   Electronically to        [id number] ([facility])       [address]       [place], [place]  [address]       Ph: [phone] or [phone]       Fax: [phone]   RxID:   [id number]   Current Allergies (reviewed today): ! ERYTHROMYCIN

## 2010-12-16 NOTE — Consult Note (Signed)
Summary: [facility]  [facility]   Imported By: Lanelle Bal 12/19/2009 11:58:53  _____________________________________________________________________  External Attachment:    Type:   Image     Comment:   External Document

## 2010-12-18 ENCOUNTER — Encounter: Payer: Self-pay | Admitting: Internal Medicine

## 2010-12-24 NOTE — Letter (Signed)
   [facility] [address] [place], [place]  [address]  [place] Phone: [phone]      December 18, 2010   [facility] [address] [place], [place] [address]  RE:  LAB RESULTS  Dear  Ms. Ramanathan,  The following is an interpretation of your most recent lab tests.  Please take note of any instructions provided or changes to medications that have resulted from your lab work.  ELECTROLYTES:  Good - no changes needed  KIDNEY FUNCTION TESTS:  Good - no changes needed   THYROID STUDIES:  Thyroid studies normal TSH: 2.447     CBC:  Good - no changes needed       Sincerely Yours,    Dr. Thomos Lemons  Appended Document:  mailed

## 2011-01-07 NOTE — Assessment & Plan Note (Signed)
Summary: right knee pain/ss   Vital Signs:  Patient profile:   35 year old female Height:      64 inches Weight:      169 pounds BMI:     29.11 O2 Sat:      99 % on Room air Temp:     98.5 degrees F oral Pulse rate:   60 / minute Pulse rhythm:   regular Resp:     18 per minute BP sitting:   90 / 70  (right arm) Cuff size:   regular  Vitals Entered By: Glendell Docker CMA (December 16, 2010 3:23 PM)  O2 Flow:  Room air CC: Right Knee Pain, Lower Extremity Joint pain Is Patient Diabetic? No Pain Assessment Patient in pain? yes     Location: knee Intensity: 4 Type: aching & throbbing Onset of pain  With activity Comments c/o right knee pain throbbing and clicking daily, worse with exercise   Primary Care Provider:  Dondra Spry DO  CC:  Right Knee Pain and Lower Extremity Joint pain.  History of Present Illness:  Lower Extremity Joint Pain      This is a 35 year old woman who presents with Lower Extremity Joint pain.  The patient reports stiffness for >1 hr, but denies swelling and redness.  The pain began gradually.  The pain is described as aching, intermittent, and activity related.    adj d/o with anxiety - feels like sertraline not effective intermittent irritability stress of raising child worries about something bad happening to husband or her child  Allergies: 1)  ! Erythromycin  Past History:  Past Medical History: Depression / dysthymia         Family History: no family hx of aneurysm no fam hx of polycystic kidney dz.     family hx of depression / possible bipolar  Physical Exam  General:  alert, well-developed, and well-nourished.   Lungs:  normal respiratory effort.   Heart:  Normal rate and regular rhythm. S1 and S2 normal without gallop, murmur, click, rub or other extra sounds. Msk:  no joint swelling and no joint instability.     Impression & Recommendations:  Problem # 1:  KNEE PAIN, RIGHT (ICD-719.46) patellofemoral syndrome and  or chronomalacia trial of PT  Orders: Physical Therapy Referral (PT)  Problem # 2:  DEPRESSION/ANXIETY (ICD-300.4) Assessment: Deteriorated increase sertraline to 100 mg pt symptoms get worse - consider add wellbutrin Orders: T-Basic Metabolic Panel [id number]) T-CBC No Diff (27782-42353) T-TSH (61443-15400) T-T4, Free [phone])  Complete Medication List: 1)  Sertraline Hcl 100 Mg Tabs (Sertraline hcl) .... One by mouth once daily 2)  Alprazolam 0.5 Mg Tabs (Alprazolam) .... One tablet by mouth prior to flying  Patient Instructions: 1)  Please schedule a follow-up appointment in 2 months. Prescriptions: SERTRALINE HCL 100 MG TABS (SERTRALINE HCL) one by mouth once daily  #90 x 1   Entered and Authorized by:   D. Thomos Lemons DO   Signed by:   D. Thomos Lemons DO on 12/16/2010   Method used:   Print then Give to Patient   RxID:   [id number]    Orders Added: 1)  T-Basic Metabolic Panel [80048-22910] 2)  T-CBC No Diff [85027-10000] 3)  T-TSH [50539-76734] 4)  T-T4, Free [[id number]] 5)  Physical Therapy Referral [PT] 6)  Est. Patient Level III [73532]    Current Allergies (reviewed today): ! ERYTHROMYCIN

## 2011-01-19 ENCOUNTER — Ambulatory Visit (INDEPENDENT_AMBULATORY_CARE_PROVIDER_SITE_OTHER): Payer: PRIVATE HEALTH INSURANCE | Admitting: Family

## 2011-01-19 ENCOUNTER — Encounter: Payer: Self-pay | Admitting: Family

## 2011-01-19 DIAGNOSIS — B9789 Other viral agents as the cause of diseases classified elsewhere: Secondary | ICD-10-CM | POA: Insufficient documentation

## 2011-01-19 DIAGNOSIS — J029 Acute pharyngitis, unspecified: Secondary | ICD-10-CM

## 2011-01-27 NOTE — Assessment & Plan Note (Signed)
Summary: sore throat and fever/ss--rm 4   Vital Signs:  Patient profile:   35 year old female Height:      64 inches Weight:      168 pounds BMI:     28.94 Temp:     98.0 degrees F oral Pulse rate:   72 / minute Pulse rhythm:   regular Resp:     16 per minute BP sitting:   100 / 74  (right arm) Cuff size:   regular  Vitals Entered By: Mervin Kung CMA Duncan Dull) (January 19, 2011 3:29 PM) CC: Pt states she feels achey all over, chills and sore throat. Husband recently has strep, son has hand, foot and mouth disease. Is Patient Diabetic? No Pain Assessment Patient in pain? no        Primary Care Provider:  Dondra Spry DO  CC:  Pt states she feels achey all over, chills and sore throat. Husband recently has strep, son has hand, and foot and mouth disease.Marland Kitchen  History of Present Illness: Patient is a 35 year old female who presents today with chief complaint of feeling achy. Symptoms started yesterday, and are associated with weakness, sweats/chills, sore throat. Symptoms are not associated with nasal congestion. The patient reports that she did not have a flu shot this year. She does report that her husband was diagnosed with hand, foot and mouth disease, and that her son was recently treated for strep. The patient has tried Tylenol.   Allergies: 1)  ! Erythromycin  Past History:  Past Medical History: Last updated: 12/16/2010 Depression / dysthymia         Review of Systems       see history of present illness  Physical Exam  General:  Well-developed,well-nourished,in no acute distress; alert,appropriate and cooperative throughout examination Head:  Normocephalic and atraumatic without obvious abnormalities. No apparent alopecia or balding. Eyes:  pupils equal round reactive to light and accommodation sclera are clear Ears:  left tympanic membrane is slightly dull without erythema or bulging. Right tympanic membrane is normal Mouth:  Oral mucosa and oropharynx  without lesions or exudates.  Teeth in good repair. Lungs:  Normal respiratory effort, chest expands symmetrically. Lungs are clear to auscultation, no crackles or wheezes. Heart:  Normal rate and regular rhythm. S1 and S2 normal without gallop, murmur, click, rub or other extra sounds.   Impression & Recommendations:  Problem # 1:  VIRAL INFECTION, ACUTE (ICD-079.99) Assessment New Suspect viral etiology. Recommended supportive measures including rest, Tylenol, and ibuprofen as needed for pain or fever. Rapid strep was negative today. Patient is instructed to contact us if her symptoms worsen or if they're not improved in 72 hours.  Complete Medication List: 1)  Sertraline Hcl 100 Mg Tabs (Sertraline hcl) .... One by mouth once daily 2)  Alprazolam 0.5 Mg Tabs (Alprazolam) .... One tablet by mouth prior to flying  Other Orders: Rapid Strep (82956)  Patient Instructions: 1)  Call if your symptoms worsen or if they do not improve in the next 48-72 hours. 2)  Take 400-600mg  of Ibuprofen (Advil, Motrin) with food every 4-6 hours as needed for relief of pain or comfort of fever.   Orders Added: 1)  Rapid Strep [21308] 2)  Est. Patient Level III [65784]    Current Allergies (reviewed today): ! ERYTHROMYCIN  Laboratory Results   Date/Time Reported: Mervin Kung CMA Duncan Dull)  January 19, 2011 3:50 PM   Other Tests  Rapid Strep: negative  Kit Test Internal QC:  Positive   (Normal Range: Negative)

## 2011-02-26 LAB — CBC
MCHC: 34.4 g/dL (ref 30.0–36.0)
MCV: 96.4 fL (ref 78.0–100.0)
MCV: 97.5 fL (ref 78.0–100.0)
Platelets: 204 10*3/uL (ref 150–400)
RDW: 13.3 % (ref 11.5–15.5)
RDW: 13.4 % (ref 11.5–15.5)
WBC: 13.8 10*3/uL — ABNORMAL HIGH (ref 4.0–10.5)

## 2011-02-26 LAB — RPR: RPR Ser Ql: NONREACTIVE

## 2011-03-02 ENCOUNTER — Ambulatory Visit (INDEPENDENT_AMBULATORY_CARE_PROVIDER_SITE_OTHER): Payer: PRIVATE HEALTH INSURANCE | Admitting: Family

## 2011-03-02 ENCOUNTER — Encounter: Payer: Self-pay | Admitting: Internal Medicine

## 2011-03-02 ENCOUNTER — Encounter: Payer: Self-pay | Admitting: Family

## 2011-03-02 VITALS — BP 100/64 | HR 78 | Temp 98.7°F | Resp 16 | Ht 64.02 in | Wt 162.1 lb

## 2011-03-02 DIAGNOSIS — K5289 Other specified noninfective gastroenteritis and colitis: Secondary | ICD-10-CM

## 2011-03-02 DIAGNOSIS — R197 Diarrhea, unspecified: Secondary | ICD-10-CM

## 2011-03-02 DIAGNOSIS — K529 Noninfective gastroenteritis and colitis, unspecified: Secondary | ICD-10-CM

## 2011-03-02 LAB — BASIC METABOLIC PANEL
CO2: 26 mEq/L (ref 19–32)
Calcium: 9.1 mg/dL (ref 8.4–10.5)
Chloride: 103 mEq/L (ref 96–112)
Creat: 0.7 mg/dL (ref 0.40–1.20)
Sodium: 139 mEq/L (ref 135–145)

## 2011-03-02 LAB — CBC
HCT: 38.4 % (ref 36.0–46.0)
Hemoglobin: 13.4 g/dL (ref 12.0–15.0)
MCHC: 34.9 g/dL (ref 30.0–36.0)
RBC: 4.42 MIL/uL (ref 3.87–5.11)

## 2011-03-02 LAB — HEPATIC FUNCTION PANEL
ALT: 16 U/L (ref 0–35)
AST: 24 U/L (ref 0–37)
Alkaline Phosphatase: 45 U/L (ref 39–117)
Bilirubin, Direct: 0.2 mg/dL (ref 0.0–0.3)
Total Bilirubin: 0.8 mg/dL (ref 0.3–1.2)

## 2011-03-02 MED ORDER — DIPHENOXYLATE-ATROPINE 2.5-0.025 MG PO TABS: 1.0000 | ORAL_TABLET | Freq: Four times a day (QID) | ORAL | Status: AC | PRN

## 2011-03-02 NOTE — Assessment & Plan Note (Signed)
Suspect that her symptoms are due to Acute gastroenteritis given + sick family members with same symptoms.  Will plan to treat the diarrhea with lomotil PRN.  Pt to call if her symptoms worsen or if they do not improve.  Check labs as below.

## 2011-03-02 NOTE — Progress Notes (Signed)
  Subjective:    Patient ID: Autumn [name], female    DOB: 08/18/1976, 35 y.o.   MRN: [id number]  HPI  Ms.  Autumn [name] is a 35 yr old female who presents today with chief complaint of diarrhea.  Symptoms started 8 days ago.  Noted 15 loose watery stools on Friday. She had associated vomitting. Saturday was achey.  She has had 5 loose stools today.  Both her child and her husband have had loose stools- her child is now better.  She has tried imodium which helped until Friday. Saturday took kaopectate.  + associated abdominal cramping.  Tolerating gatorade and bland diet.  "Feels like she has been "hit by a bus." Difficulty sleeping.  + weakenss  Review of Systems  Respiratory: Negative for cough and choking.   Cardiovascular: Negative for chest pain.  Gastrointestinal: Positive for nausea, vomiting, diarrhea and blood in stool.   Past Medical History  Diagnosis Date  . Depression     History   Social History  . Marital Status: Married    Spouse Name: N/A    Number of Children: 1  . Years of Education: N/A   Occupational History  . TRAINING    Social History Main Topics  . Smoking status: Former Smoker    Quit date: 11/16/2005  . Smokeless tobacco: Never Used  . Alcohol Use: No  . Drug Use: Not on file  . Sexually Active: Not on file   Other Topics Concern  . Not on file   Social History Narrative  . No narrative on file    No past surgical history on file.  Family History  Problem Relation Age of Onset  . Aneurysm Neg Hx   . Polycystic kidney disease Neg Hx   . Depression Other   . Bipolar disorder Other     Allergies  Allergen Reactions  . Erythromycin     REACTION: vomiting    No current outpatient prescriptions on file prior to visit.    BP 100/64  Pulse 78  Temp(Src) 98.7 F (37.1 C) (Oral)  Resp 16  Ht 5' 4.02" (1.626 m)  Wt 162 lb 1.3 oz (73.519 kg)  BMI 27.81 kg/m2        Objective:   Physical Exam  Constitutional: She appears  well-developed and well-nourished.  HENT:  Head: Normocephalic and atraumatic.  Eyes: Pupils are equal, round, and reactive to light.  Neck: Neck supple.  Pulmonary/Chest: Effort normal and breath sounds normal.  Abdominal: Soft. Bowel sounds are normal.  Skin: Skin is warm and dry.          Assessment & Plan:

## 2011-03-02 NOTE — Patient Instructions (Signed)
Call if symptoms worsen, or if they are not improved in 48-72 hours. Go to ER if you are unable to keep down food/liquid or medicine. Call if you develop blood in stool, abdominal pain, or fever over 101. Follow up with Dr. Artist Pais in 1 week.

## 2011-03-30 ENCOUNTER — Encounter: Payer: Self-pay | Admitting: Family

## 2011-03-30 ENCOUNTER — Ambulatory Visit (INDEPENDENT_AMBULATORY_CARE_PROVIDER_SITE_OTHER): Payer: PRIVATE HEALTH INSURANCE | Admitting: Family

## 2011-03-30 DIAGNOSIS — J019 Acute sinusitis, unspecified: Secondary | ICD-10-CM

## 2011-03-30 DIAGNOSIS — F341 Dysthymic disorder: Secondary | ICD-10-CM

## 2011-03-30 MED ORDER — AMOXICILLIN 500 MG PO CAPS: 1000.0000 mg | ORAL_CAPSULE | Freq: Three times a day (TID) | ORAL | Status: AC

## 2011-03-30 MED ORDER — AMOXICILLIN 500 MG PO CAPS: 1000.0000 mg | ORAL_CAPSULE | Freq: Three times a day (TID) | ORAL | Status: DC

## 2011-03-30 NOTE — Progress Notes (Signed)
  Subjective:    Patient ID: Autumn [name], female    DOB: Nov 26, 1975, 35 y.o.   MRN: [id number]  HPI  Ms.  Fesler is a 35 yr old female   1. Sinus pain- x 6 days.  Nasal drainage green and thick.  Denies fever.  Bilateral ear pain.  2. Pregnancy- today supposed to have her period- had + urine pregnancy test.        Review of Systems See HPI  Past Medical History  Diagnosis Date  . Depression     History   Social History  . Marital Status: Married    Spouse Name: N/A    Number of Children: 1  . Years of Education: N/A   Occupational History  . TRAINING    Social History Main Topics  . Smoking status: Former Smoker    Quit date: 11/16/2005  . Smokeless tobacco: Never Used  . Alcohol Use: No  . Drug Use: Not on file  . Sexually Active: Not on file   Other Topics Concern  . Not on file   Social History Narrative  . No narrative on file    No past surgical history on file.  Family History  Problem Relation Age of Onset  . Aneurysm Neg Hx   . Polycystic kidney disease Neg Hx   . Depression Other   . Bipolar disorder Other     Allergies  Allergen Reactions  . Erythromycin     REACTION: vomiting    Current Outpatient Prescriptions on File Prior to Visit  Medication Sig Dispense Refill  . sertraline (ZOLOFT) 100 MG tablet Take 50 mg by mouth daily.         BP 100/80  Pulse 72  Temp(Src) 98.6 F (37 C) (Oral)  Resp 16  Wt 164 lb (74.39 kg)  SpO2 99%       Objective:   Physical Exam  Constitutional: She appears well-developed and well-nourished.  HENT:  Mouth/Throat: Uvula is midline, oropharynx is clear and moist and mucous membranes are normal.       + tenderness to palpation of frontal sinuses.  Cardiovascular: Normal rate and regular rhythm.   Pulmonary/Chest: Effort normal and breath sounds normal. No respiratory distress. She has no wheezes. She has no rales.          Assessment & Plan:

## 2011-03-30 NOTE — Patient Instructions (Addendum)
Please cut back your zoloft to 50mg  once daily and discuss need for complete discontinuation with your OB on Tuesday. Start amoxicillin if your symptoms worsen or if they have not improved in 48 hours.

## 2011-03-31 NOTE — Op Note (Signed)
NAMESELIA, [name] NO.:  [id number]   MEDICAL RECORD NO.:  [id number]          PATIENT TYPE:  [facility]   LOCATION:  [facility]                          FACILITY:  [facility]   PHYSICIAN:  Genia Del, M.D.DATE OF BIRTH:  Feb 03, 1976   DATE OF PROCEDURE:  02/24/2008  DATE OF DISCHARGE:                               OPERATIVE REPORT   PREOPERATIVE DIAGNOSIS:  Eight plus weeks gestation, missed abortion.   POSTOPERATIVE DIAGNOSIS:  Eight plus weeks gestation, missed abortion.   PROCEDURE:  Dilatation and evacuation.   SURGEON:  Genia Del, M.D.   ANESTHESIOLOGIST:  Cristela Blue, M.D.   DESCRIPTION OF PROCEDURE:  Under MAC analgesia, the patient is in  lithotomy position.  She is prepped with Betadine on the suprapubic,  vulvar and vaginal areas and draped as usual.  The bladder is  catheterized.  The vaginal exam revealed an anteverted uterus about 8  cm, mobile, no adnexal mass.  The cervix is long and closed.  No vaginal  bleeding.  The speculum is inserted in the vagina.  The paracervical  block is done with Xylocaine 1% 20 mL total at 4 and 8 o'clock.  We do a  hysterometry that is at 9 cm.  We then dilate the cervix up to Hegar #33  without difficulty.  A curved #9 suction curette is used.  The  intrauterine cavity is suctioned completely.  We then go with a sharp  curette and systematically curettage the entire intrauterine cavity.  The uterine sound is heard throughout.  We then used the suction curette  to assure complete evacuation of the cavity and removal of blood clots.  The uterus contracts well on the instrument.  Hemostasis is adequate.  We remove the instruments including the tenaculum on the anterior lip of  the cervix.  Silver nitrate is used to complete hemostasis at that  level.  We then remove the speculum.  The estimated blood loss was less  than 50 mL.  No complications occurred and the patient was brought to  recovery room in good stable  status.  Note that her Rh is negative and  she will receive a RhoGAM before discharge.      Genia Del, M.D.  Electronically Signed     ML/MEDQ  D:  02/24/2008  T:  02/24/2008  Job:  [id number]

## 2011-04-01 NOTE — Assessment & Plan Note (Signed)
Pt is on zoloft which is category C.  I have advised her to cut her dose down to 50 mg, and discuss safety of continuation of this medication with her GYN on Tuesday.

## 2011-04-01 NOTE — Assessment & Plan Note (Signed)
Pt has had symptoms for approximately 5 days.  She is reluctant to start antibiotics given early pregnancy.  I told her that it is reasonable to wait another two days or so as things might improve on own. However if symptoms worsen, or if they are not improved in 2 days, I recommended that she start amoxicillin which is category B.  She verbalizes understanding.

## 2011-08-11 LAB — CBC
HCT: 37.8
MCHC: 35.4
MCV: 91.5
Platelets: 238
RDW: 12.7
WBC: 5.9

## 2011-08-11 LAB — ABO/RH: ABO/RH(D): O NEG

## 2011-08-11 LAB — RH IMMUNE GLOBULIN WORKUP (NOT WOMEN'S HOSP)

## 2011-09-01 ENCOUNTER — Encounter: Payer: Self-pay | Admitting: Family

## 2011-09-01 ENCOUNTER — Ambulatory Visit (INDEPENDENT_AMBULATORY_CARE_PROVIDER_SITE_OTHER): Payer: PRIVATE HEALTH INSURANCE | Admitting: Family

## 2011-09-01 ENCOUNTER — Ambulatory Visit: Payer: PRIVATE HEALTH INSURANCE | Admitting: Family

## 2011-09-01 VITALS — BP 100/64 | HR 78 | Temp 98.7°F | Resp 16 | Ht 64.0 in | Wt 178.0 lb

## 2011-09-01 DIAGNOSIS — J329 Chronic sinusitis, unspecified: Secondary | ICD-10-CM

## 2011-09-01 MED ORDER — AMOXICILLIN 500 MG PO TABS: 1000.0000 mg | ORAL_TABLET | Freq: Three times a day (TID) | ORAL | Status: AC

## 2011-09-01 NOTE — Progress Notes (Signed)
  Subjective:    Patient ID: Autumn [name], female    DOB: [birth year], 35 y.o.   MRN: [id number]  HPI  Autumn [name] is a 35 yr old female who presents today with chief complaint of copious green sinus drainage and pressure.  Symptoms started approximately 7 days ago.  Symptoms are worsening and are associated with cough. She has been using honey and lemon as needed for cough and tylenol.  She has had little improvement with these measures. The patient is [redacted] weeks pregnant.   Review of Systems See HPI  Past Medical History  Diagnosis Date  . Depression     History   Social History  . Marital Status: Married    Spouse Name: N/A    Number of Children: 1  . Years of Education: N/A   Occupational History  . TRAINING    Social History Main Topics  . Smoking status: Former Smoker    Quit date: 11/16/2005  . Smokeless tobacco: Never Used  . Alcohol Use: No  . Drug Use: Not on file  . Sexually Active: Not on file   Other Topics Concern  . Not on file   Social History Narrative  . No narrative on file    No past surgical history on file.  Family History  Problem Relation Age of Onset  . Aneurysm Neg Hx   . Polycystic kidney disease Neg Hx   . Depression Other   . Bipolar disorder Other     Allergies  Allergen Reactions  . Erythromycin     REACTION: vomiting    Current Outpatient Prescriptions on File Prior to Visit  Medication Sig Dispense Refill  . Prenatal Vit-Fe Psac Cmplx-FA (PRENATAL MULTIVITAMIN) 60-1 MG tablet Take 1 tablet by mouth daily with breakfast.          BP 100/64  Pulse 78  Temp(Src) 98.7 F (37.1 C) (Oral)  Resp 16  Ht 5\' 4"  (1.626 m)  Wt 178 lb 0.6 oz (80.758 kg)  BMI 30.56 kg/m2  SpO2 97%       Objective:   Physical Exam  Constitutional: She appears well-developed and well-nourished. No distress.  HENT:  Head: Normocephalic.  Right Ear: Tympanic membrane and ear canal normal.  Left Ear: Tympanic membrane and ear canal  normal.  Mouth/Throat: Uvula is midline, oropharynx is clear and moist and mucous membranes are normal.       mildy pharyngeal erythema without exudates.    Eyes: Conjunctivae are normal.  Cardiovascular: Normal rate and regular rhythm.   No murmur heard. Pulmonary/Chest: Effort normal and breath sounds normal. No respiratory distress. She has no wheezes. She has no rales. She exhibits no tenderness.  Lymphadenopathy:    She has no cervical adenopathy.  Skin: Skin is warm and dry. No erythema.  Psychiatric: She has a normal mood and affect. Her behavior is normal. Judgment and thought content normal.          Assessment & Plan:

## 2011-09-01 NOTE — Patient Instructions (Signed)

## 2011-09-01 NOTE — Assessment & Plan Note (Signed)
Given duration of symptoms will plan to treat with amoxicillin.  Pt is instructed to contact us if her symptoms worsen or if she is not feeling better in 2-3 days.  She verbalizes understanding.

## 2012-02-09 ENCOUNTER — Ambulatory Visit (HOSPITAL_BASED_OUTPATIENT_CLINIC_OR_DEPARTMENT_OTHER)
Admission: RE | Admit: 2012-02-09 | Discharge: 2012-02-09 | Disposition: A | Discharge status: Home / Self Care | Payer: No Typology Code available for payment source | Source: Ambulatory Visit | Attending: Family | Admitting: Family

## 2012-02-09 ENCOUNTER — Encounter: Payer: Self-pay | Admitting: Family

## 2012-02-09 ENCOUNTER — Ambulatory Visit (INDEPENDENT_AMBULATORY_CARE_PROVIDER_SITE_OTHER): Payer: PRIVATE HEALTH INSURANCE | Admitting: Family

## 2012-02-09 ENCOUNTER — Telehealth: Payer: Self-pay | Admitting: Family

## 2012-02-09 VITALS — BP 80/60 | HR 97 | Temp 98.0°F | Resp 16 | Ht 64.0 in | Wt 171.1 lb

## 2012-02-09 DIAGNOSIS — J40 Bronchitis, not specified as acute or chronic: Secondary | ICD-10-CM

## 2012-02-09 DIAGNOSIS — J45901 Unspecified asthma with (acute) exacerbation: Secondary | ICD-10-CM | POA: Insufficient documentation

## 2012-02-09 DIAGNOSIS — R059 Cough, unspecified: Secondary | ICD-10-CM | POA: Insufficient documentation

## 2012-02-09 DIAGNOSIS — R05 Cough: Secondary | ICD-10-CM

## 2012-02-09 DIAGNOSIS — R0989 Other specified symptoms and signs involving the circulatory and respiratory systems: Secondary | ICD-10-CM

## 2012-02-09 MED ORDER — AMOXICILLIN-POT CLAVULANATE 875-125 MG PO TABS: 1.0000 | ORAL_TABLET | Freq: Two times a day (BID) | ORAL | Status: DC

## 2012-02-09 NOTE — Patient Instructions (Signed)
Please complete chest x ray on the first floor.  

## 2012-02-09 NOTE — Telephone Encounter (Signed)
Please call pt on home number and let her know that her CXR is neg for pneumonia.  Does show bronchitis.  I will send rx for augmentin which is consider safe for breast feeding.

## 2012-02-09 NOTE — Assessment & Plan Note (Signed)
Due to LLL crackles a cxr is performed.  CXR is neg for pneumonia, but does note bronchitis. Will plan to treat with Augmentin.  I rechecked BP today in the office : 94/65.  Reminded her to drink a lot of water today. She reports dizziness- but did have mirena placed 2 weeks ago.  This may be cause of dizziness.  Recommended to patient that she let us know if dizziness does not improve.

## 2012-02-09 NOTE — Telephone Encounter (Signed)
Pt.notified

## 2012-02-09 NOTE — Progress Notes (Signed)
  Subjective:    Patient ID: Autumn [name], female    DOB: October 19, 1976, 36 y.o.   MRN: [id number]  HPI  Ms.  Autumn [name] is a 36 yr old female who presents today with chief complaint of cough.  Cough started on 3/15.  Chest feels tight with cough. She reports that this AM she felt associated light headedness.  Cough is productive at times.  Denies associated fever.  Not taking any otc meds. She is nursing.     Review of Systems See HPI  Past Medical History  Diagnosis Date  . Depression     History   Social History  . Marital Status: Married    Spouse Name: N/A    Number of Children: 1  . Years of Education: N/A   Occupational History  . TRAINING    Social History Main Topics  . Smoking status: Former Smoker    Quit date: 11/16/2005  . Smokeless tobacco: Never Used  . Alcohol Use: No  . Drug Use: Not on file  . Sexually Active: Not on file   Other Topics Concern  . Not on file   Social History Narrative  . No narrative on file    No past surgical history on file.  Family History  Problem Relation Age of Onset  . Aneurysm Neg Hx   . Polycystic kidney disease Neg Hx   . Depression Other   . Bipolar disorder Other     Allergies  Allergen Reactions  . Erythromycin     REACTION: vomiting    Current Outpatient Prescriptions on File Prior to Visit  Medication Sig Dispense Refill  . Prenatal Vit-Fe Psac Cmplx-FA (PRENATAL MULTIVITAMIN) 60-1 MG tablet Take 1 tablet by mouth daily with breakfast.          BP 80/60  Pulse 97  Temp(Src) 98 F (36.7 C) (Oral)  Resp 16  Ht 5\' 4"  (1.626 m)  Wt 171 lb 1.9 oz (77.62 kg)  BMI 29.37 kg/m2  SpO2 97%  Breastfeeding? Unknown       Objective:   Physical Exam  Constitutional: She appears well-developed and well-nourished. No distress.  HENT:  Head: Normocephalic and atraumatic.  Right Ear: Tympanic membrane and ear canal normal.  Left Ear: Tympanic membrane and ear canal normal.  Mouth/Throat: No  oropharyngeal exudate, posterior oropharyngeal edema or posterior oropharyngeal erythema.  Cardiovascular: Normal rate and regular rhythm.   No murmur heard. Pulmonary/Chest: Effort normal.       Few crackles noted at left base.  No wheezing.            Assessment & Plan:

## 2012-02-17 ENCOUNTER — Telehealth: Payer: Self-pay | Admitting: *Deleted

## 2012-02-17 MED ORDER — ALBUTEROL SULFATE HFA 108 (90 BASE) MCG/ACT IN AERS: 2.0000 | INHALATION_SPRAY | Freq: Four times a day (QID) | RESPIRATORY_TRACT | Status: DC | PRN

## 2012-02-17 NOTE — Telephone Encounter (Signed)
Spoke with pt. She tells me that she is still coughing, but overall feeling better. I instructed her to stop augmentin, use benadryl prn hives. She describes the chest tightness as the same tightness that she has had since she developed bronchitis, not any worse since she developed hives. She has completed 7 days abx. Reasonable to d/c antibiotics at this point. I advised pt to try albuterol. Follow up in office on Friday if symptoms worsen or do not continue to improve.

## 2012-02-17 NOTE — Telephone Encounter (Signed)
Received call from pt that she has hives all over, no swelling of lips, tongue or throat. Has taken Augmentin x 1 week, still has cough and chest tightness is only slightly improved. Advised pt per [facility] instruction that we will send additional 3 days of alternate abx as pt is breast feeding. She may also take benadryl for the hives.  Pt advised to stop Augmentin. Pt also wanted to know if Claritin would be better / safer for her to treat her hives than Benadryl?  Please advise.

## 2012-02-22 ENCOUNTER — Ambulatory Visit (INDEPENDENT_AMBULATORY_CARE_PROVIDER_SITE_OTHER): Payer: PRIVATE HEALTH INSURANCE | Admitting: Family

## 2012-02-22 ENCOUNTER — Encounter: Payer: Self-pay | Admitting: Family

## 2012-02-22 ENCOUNTER — Ambulatory Visit (HOSPITAL_BASED_OUTPATIENT_CLINIC_OR_DEPARTMENT_OTHER)
Admission: RE | Admit: 2012-02-22 | Discharge: 2012-02-22 | Disposition: A | Discharge status: Home / Self Care | Payer: No Typology Code available for payment source | Source: Ambulatory Visit | Attending: Family | Admitting: Family

## 2012-02-22 VITALS — BP 100/74 | HR 86 | Temp 98.4°F | Resp 16 | Ht 64.0 in | Wt 169.0 lb

## 2012-02-22 DIAGNOSIS — L509 Urticaria, unspecified: Secondary | ICD-10-CM | POA: Insufficient documentation

## 2012-02-22 DIAGNOSIS — R05 Cough: Secondary | ICD-10-CM

## 2012-02-22 DIAGNOSIS — R062 Wheezing: Secondary | ICD-10-CM

## 2012-02-22 DIAGNOSIS — R059 Cough, unspecified: Secondary | ICD-10-CM | POA: Insufficient documentation

## 2012-02-22 DIAGNOSIS — J45901 Unspecified asthma with (acute) exacerbation: Secondary | ICD-10-CM

## 2012-02-22 MED ORDER — PREDNISONE 10 MG PO TABS: ORAL_TABLET | ORAL | Status: DC

## 2012-02-22 MED ORDER — LEVOFLOXACIN 500 MG PO TABS: 500.0000 mg | ORAL_TABLET | Freq: Every day | ORAL | Status: DC

## 2012-02-22 NOTE — Patient Instructions (Signed)
Please complete your chest x-ray prior to leaving.  Call if your symptoms worsen, or if you are not feeling better in 2-3 days.

## 2012-02-22 NOTE — Progress Notes (Signed)
  Subjective:    Patient ID: Autumn [name], female    DOB: Dec 16, 1975, 36 y.o.   MRN: [id number]  HPI  Ms.  [name] is a 36 yr old female who presents today for follow up.  She was seen on 3/26 for bronchitis.  She was treated with augmentin.  Symptoms seemed to improve at first.  Unfortunately, however she developed hives 5 days into her course of treatment.  She reports that hives have been intermittent in nature and resolve with use of PRN benadryl.  She reports that she never did pick up rx for albuterol as her cough seemed to improve late last week.  Saturday night had sore throat, HA,  Yesterday ears started hurting.  Cough "came back." Has associated sinus pressure.  Review of Systems    see HPI  Past Medical History  Diagnosis Date  . Depression     History   Social History  . Marital Status: Married    Spouse Name: N/A    Number of Children: 1  . Years of Education: N/A   Occupational History  . TRAINING    Social History Main Topics  . Smoking status: Former Smoker    Quit date: 11/16/2005  . Smokeless tobacco: Never Used  . Alcohol Use: No  . Drug Use: Not on file  . Sexually Active: Not on file   Other Topics Concern  . Not on file   Social History Narrative  . No narrative on file    No past surgical history on file.  Family History  Problem Relation Age of Onset  . Aneurysm Neg Hx   . Polycystic kidney disease Neg Hx   . Depression Other   . Bipolar disorder Other     Allergies  Allergen Reactions  . Augmentin (Amoxicillin-Pot Clavulanate) Hives  . Erythromycin     REACTION: vomiting    Current Outpatient Prescriptions on File Prior to Visit  Medication Sig Dispense Refill  . Prenatal Vit-Fe Psac Cmplx-FA (PRENATAL MULTIVITAMIN) 60-1 MG tablet Take 1 tablet by mouth daily with breakfast.        . sertraline (ZOLOFT) 25 MG tablet Take 25 mg by mouth daily.      Marland Kitchen albuterol (PROVENTIL HFA;VENTOLIN HFA) 108 (90 BASE) MCG/ACT inhaler Inhale  2 puffs into the lungs every 6 (six) hours as needed for wheezing.  1 Inhaler  0    BP 100/74  Pulse 86  Temp(Src) 98.4 F (36.9 C) (Oral)  Resp 16  Ht 5\' 4"  (1.626 m)  Wt 169 lb 0.6 oz (76.676 kg)  BMI 29.02 kg/m2  SpO2 98%  LMP 01/30/2012    Objective:   Physical Exam  Constitutional: She appears well-developed and well-nourished. No distress.  HENT:  Head: Normocephalic and atraumatic.  Right Ear: Tympanic membrane and ear canal normal.  Left Ear: Tympanic membrane and ear canal normal.  Mouth/Throat: No oropharyngeal exudate, posterior oropharyngeal edema or posterior oropharyngeal erythema.  Cardiovascular: Normal rate and regular rhythm.   No murmur heard. Pulmonary/Chest: Effort normal. She has no decreased breath sounds. She has wheezes in the right middle field and the right lower field. She has no rhonchi. She has no rales.  Skin: Skin is warm and dry.       No hives noted.   Psychiatric: She has a normal mood and affect. Her behavior is normal. Judgment and thought content normal.          Assessment & Plan:

## 2012-02-22 NOTE — Assessment & Plan Note (Signed)
No urticaria noted today.  Recommended that she add claritin once daily, use benadryl PRN.  If symptoms worsen, or if no improvement in the next week or so, will plan referral to allergist.  However, this may still be related to augmentin reaction.

## 2012-02-22 NOTE — Assessment & Plan Note (Signed)
+   right sided wheezing today on exam.  Will plan short course of prednisone, start albuterol.  Rx with levaquin for bronchitis and probable associated sinusitis.  CXR performed today is negative for infiltrate.

## 2012-03-04 ENCOUNTER — Ambulatory Visit (INDEPENDENT_AMBULATORY_CARE_PROVIDER_SITE_OTHER): Payer: PRIVATE HEALTH INSURANCE | Admitting: Family

## 2012-03-04 ENCOUNTER — Encounter: Payer: Self-pay | Admitting: Family

## 2012-03-04 VITALS — BP 90/62 | HR 77 | Temp 97.8°F | Resp 16 | Wt 173.1 lb

## 2012-03-04 DIAGNOSIS — J309 Allergic rhinitis, unspecified: Secondary | ICD-10-CM

## 2012-03-04 DIAGNOSIS — J302 Other seasonal allergic rhinitis: Secondary | ICD-10-CM

## 2012-03-04 NOTE — Patient Instructions (Signed)
Start claritin and Neti pot.  Call if symptoms worsen.

## 2012-03-04 NOTE — Progress Notes (Signed)
  Subjective:    Patient ID: [name], female    DOB: [birth year], 36 y.o.   MRN: [id number]  HPI  Ms.  Beeghly is a 36 yr old female who presents today for follow up.  Last visit she completed levaquin and a prednisone taper for bronchitis/asthma exacerbation.  She reports feeling much better after completing these medication.  Completed Levaquin5 days ago.   3 days ago developed chest heaviness and cough which is non-productive.  She reports associated HA/ears hurting.  No fever.     Review of Systems See HPI  Past Medical History  Diagnosis Date  . Depression     History   Social History  . Marital Status: Married    Spouse Name: N/A    Number of Children: 1  . Years of Education: N/A   Occupational History  . TRAINING    Social History Main Topics  . Smoking status: Former Smoker    Quit date: 11/16/2005  . Smokeless tobacco: Never Used  . Alcohol Use: No  . Drug Use: Not on file  . Sexually Active: Not on file   Other Topics Concern  . Not on file   Social History Narrative  . No narrative on file    No past surgical history on file.  Family History  Problem Relation Age of Onset  . Aneurysm Neg Hx   . Polycystic kidney disease Neg Hx   . Depression Other   . Bipolar disorder Other     Allergies  Allergen Reactions  . Augmentin (Amoxicillin-Pot Clavulanate) Hives  . Erythromycin     REACTION: vomiting    Current Outpatient Prescriptions on File Prior to Visit  Medication Sig Dispense Refill  . albuterol (PROVENTIL HFA;VENTOLIN HFA) 108 (90 BASE) MCG/ACT inhaler Inhale 2 puffs into the lungs every 6 (six) hours as needed for wheezing.  1 Inhaler  0  . Prenatal Vit-Fe Psac Cmplx-FA (PRENATAL MULTIVITAMIN) 60-1 MG tablet Take 1 tablet by mouth daily with breakfast.        . sertraline (ZOLOFT) 25 MG tablet Take 25 mg by mouth daily.        BP 90/62  Pulse 77  Temp(Src) 97.8 F (36.6 C) (Oral)  Resp 16  Wt 173 lb 1.3 oz (78.509 kg)   SpO2 99%  LMP 01/30/2012       Objective:   Physical Exam  Constitutional: She appears well-developed and well-nourished. No distress.  HENT:  Head: Normocephalic and atraumatic.  Right Ear: Tympanic membrane and ear canal normal.  Left Ear: Tympanic membrane and ear canal normal.  Mouth/Throat: Uvula is midline, oropharynx is clear and moist and mucous membranes are normal. No oropharyngeal exudate or posterior oropharyngeal edema.  Cardiovascular: Normal rate and regular rhythm.   No murmur heard. Pulmonary/Chest: Effort normal and breath sounds normal. No respiratory distress. She has no wheezes. She has no rales. She exhibits no tenderness.  Lymphadenopathy:    She has no cervical adenopathy.  Psychiatric: She has a normal mood and affect. Her behavior is normal. Judgment and thought content normal.          Assessment & Plan:

## 2012-03-07 DIAGNOSIS — J302 Other seasonal allergic rhinitis: Secondary | ICD-10-CM | POA: Insufficient documentation

## 2012-03-07 NOTE — Assessment & Plan Note (Signed)
At this point, I do not see indication for any further abx.  Suspect symptoms are related to allergies. Recommended trial of claritin and that she continue proventil PRN.  Call if symptoms worsen, or if no improvement. Pt verbalizes understanding.

## 2012-04-19 ENCOUNTER — Telehealth: Payer: Self-pay | Admitting: *Deleted

## 2012-04-19 NOTE — Telephone Encounter (Signed)
Received fax from pt requesting completion of paperwork for [employer] through [facility]. She is requesting the questionaire be faxed back to [phone] by Friday, June 7th.  Form forwarded to Provider.  Please advise.

## 2012-04-20 NOTE — Telephone Encounter (Signed)
Call placed to patient at [phone]; she was informed paperwork has been faxed to [facility]

## 2012-04-20 NOTE — Telephone Encounter (Signed)
Form completed.

## 2012-05-11 ENCOUNTER — Encounter: Payer: Self-pay | Admitting: Internal Medicine

## 2012-05-11 ENCOUNTER — Ambulatory Visit (INDEPENDENT_AMBULATORY_CARE_PROVIDER_SITE_OTHER): Payer: PRIVATE HEALTH INSURANCE | Admitting: Internal Medicine

## 2012-05-11 VITALS — BP 98/60 | HR 78 | Temp 97.9°F | Resp 16 | Wt 171.0 lb

## 2012-05-11 DIAGNOSIS — J329 Chronic sinusitis, unspecified: Secondary | ICD-10-CM

## 2012-05-11 MED ORDER — LEVOFLOXACIN 500 MG PO TABS: 500.0000 mg | ORAL_TABLET | Freq: Every day | ORAL | Status: AC

## 2012-05-11 NOTE — Progress Notes (Signed)
  Subjective:    Patient ID: Autumn [name], female    DOB: [birth year], 36 y.o.   MRN: [id number]  HPI Pt presents to clinic for evaluation of possible sinusitis. Notes ~7d h/o paranasal pressure, nasal congestion/drainage without f/c. No alleviating or exacerbating factors. Taking no medication for the problem.  Past Medical History  Diagnosis Date  . Depression    No past surgical history on file.  reports that she quit smoking about 6 years ago. She has never used smokeless tobacco. She reports that she does not drink alcohol. Her drug history not on file. family history includes Bipolar disorder in her other and Depression in her other.  There is no history of Aneurysm and Polycystic kidney disease. Allergies  Allergen Reactions  . Augmentin (Amoxicillin-Pot Clavulanate) Hives  . Erythromycin     REACTION: vomiting     Review of Systems see hpi     Objective:   Physical Exam  Nursing note and vitals reviewed. Constitutional: She appears well-developed and well-nourished. No distress.  HENT:  Head: Normocephalic and atraumatic.  Right Ear: External ear normal.  Left Ear: External ear normal.  Nose: Nose normal.  Mouth/Throat: Oropharynx is clear and moist. No oropharyngeal exudate.  Eyes: Conjunctivae and EOM are normal. No scleral icterus.  Neck: Neck supple.  Pulmonary/Chest: Effort normal and breath sounds normal.  Lymphadenopathy:    She has no cervical adenopathy.  Neurological: She is alert.  Skin: She is not diaphoretic.  Psychiatric: She has a normal mood and affect.          Assessment & Plan:

## 2012-05-11 NOTE — Assessment & Plan Note (Signed)
Given abx to hold. Begin if sx do not improve after ~48-72 hours. Followup if no improvement or worsening.

## 2012-07-25 ENCOUNTER — Encounter: Payer: Self-pay | Admitting: Family

## 2012-07-25 ENCOUNTER — Ambulatory Visit (INDEPENDENT_AMBULATORY_CARE_PROVIDER_SITE_OTHER): Payer: PRIVATE HEALTH INSURANCE | Admitting: Family

## 2012-07-25 VITALS — BP 90/64 | HR 58 | Temp 98.0°F | Resp 16 | Ht 64.0 in | Wt 167.0 lb

## 2012-07-25 DIAGNOSIS — R1013 Epigastric pain: Secondary | ICD-10-CM

## 2012-07-25 DIAGNOSIS — R109 Unspecified abdominal pain: Secondary | ICD-10-CM

## 2012-07-25 MED ORDER — RANITIDINE HCL 150 MG PO CAPS: 150.0000 mg | ORAL_CAPSULE | Freq: Two times a day (BID) | ORAL | Status: DC

## 2012-07-25 NOTE — Patient Instructions (Addendum)
Please complete your blood work prior to leaving.  Schedule abdominal ultrasound on the first floor.  Please schedule a follow up appointment in 1 month. Call if symptoms worsen prior to your scheduled follow up.

## 2012-07-25 NOTE — Progress Notes (Signed)
  Subjective:    Patient ID: Autumn [name], female    DOB: [birth year], 36 y.o.   MRN: [id number]  HPI  Pt presents today with chief complaint of abdominal pain.  Pain started 6 weeks ago. She describes the pain as sharp and epigastric in nature.  Pain is intermittent (usually only bothers her mid morning) and is associated with flatulence, bloating and nausea. She has symptoms almost every day. Notes increased frequency of BM's- about 3 soft bms a day- though she attributes this to dietary changes. She tried stopping lactose.  Didn't help. Started exercising, eating better, sleeping more. Denies associated black/bloody stools.  Pt is nursing.      Review of Systems See HPI  Past Medical History  Diagnosis Date  . Depression     History   Social History  . Marital Status: Married    Spouse Name: N/A    Number of Children: 1  . Years of Education: N/A   Occupational History  . TRAINING    Social History Main Topics  . Smoking status: Former Smoker    Quit date: 11/16/2005  . Smokeless tobacco: Never Used  . Alcohol Use: No  . Drug Use: Not on file  . Sexually Active: Not on file   Other Topics Concern  . Not on file   Social History Narrative  . No narrative on file    No past surgical history on file.  Family History  Problem Relation Age of Onset  . Aneurysm Neg Hx   . Polycystic kidney disease Neg Hx   . Depression Other   . Bipolar disorder Other     Allergies  Allergen Reactions  . Augmentin (Amoxicillin-Pot Clavulanate) Hives  . Erythromycin     REACTION: vomiting    Current Outpatient Prescriptions on File Prior to Visit  Medication Sig Dispense Refill  . albuterol (PROVENTIL HFA;VENTOLIN HFA) 108 (90 BASE) MCG/ACT inhaler Inhale 2 puffs into the lungs every 6 (six) hours as needed for wheezing.  1 Inhaler  0  . Prenatal Vit-Fe Psac Cmplx-FA (PRENATAL MULTIVITAMIN) 60-1 MG tablet Take 1 tablet by mouth daily with breakfast.        . sertraline  (ZOLOFT) 25 MG tablet Take 25 mg by mouth daily.      . ranitidine (ZANTAC) 150 MG capsule Take 1 capsule (150 mg total) by mouth 2 (two) times daily.  60 capsule  0    BP 90/64  Pulse 58  Temp 98 F (36.7 C) (Oral)  Resp 16  Ht 5\' 4"  (1.626 m)  Wt 167 lb (75.751 kg)  BMI 28.67 kg/m2  SpO2 99%       Objective:   Physical Exam  Constitutional: She appears well-developed and well-nourished. No distress.  HENT:  Head: Normocephalic and atraumatic.  Cardiovascular: Normal rate and regular rhythm.   No murmur heard. Pulmonary/Chest: Effort normal and breath sounds normal. No respiratory distress. She has no wheezes. She has no rales. She exhibits no tenderness.  Skin: Skin is warm and dry.  Psychiatric: She has a normal mood and affect. Her behavior is normal. Judgment and thought content normal.          Assessment & Plan:

## 2012-07-26 DIAGNOSIS — R109 Unspecified abdominal pain: Secondary | ICD-10-CM | POA: Insufficient documentation

## 2012-07-26 LAB — HEPATIC FUNCTION PANEL
ALT: 15 U/L (ref 0–35)
Albumin: 4.8 g/dL (ref 3.5–5.2)
Alkaline Phosphatase: 68 U/L (ref 39–117)
Indirect Bilirubin: 0.3 mg/dL (ref 0.0–0.9)
Total Protein: 7 g/dL (ref 6.0–8.3)

## 2012-07-26 NOTE — Assessment & Plan Note (Signed)
Will obtain LFT's and an abdominal ultrasound to evaluate GB.  Add empiric zantac.  Plan follow up in 1 month.  If no improvement of if symptoms worsen, plan referral to GI. IBS is a potential cause of her symptoms as well.

## 2012-07-29 ENCOUNTER — Inpatient Hospital Stay (HOSPITAL_BASED_OUTPATIENT_CLINIC_OR_DEPARTMENT_OTHER): Admission: RE | Admit: 2012-07-29 | Payer: PRIVATE HEALTH INSURANCE | Source: Ambulatory Visit

## 2012-08-02 ENCOUNTER — Ambulatory Visit (HOSPITAL_BASED_OUTPATIENT_CLINIC_OR_DEPARTMENT_OTHER)
Admission: RE | Admit: 2012-08-02 | Discharge: 2012-08-02 | Disposition: A | Discharge status: Home / Self Care | Payer: No Typology Code available for payment source | Source: Ambulatory Visit | Attending: Family | Admitting: Family

## 2012-08-02 DIAGNOSIS — R1013 Epigastric pain: Secondary | ICD-10-CM | POA: Insufficient documentation

## 2012-11-02 ENCOUNTER — Other Ambulatory Visit: Payer: Self-pay | Admitting: *Deleted

## 2012-11-02 NOTE — Telephone Encounter (Signed)
Received call from pt stating she is going to need a refill on her Sertraline. Was originally prescribed by Dr Artist Pais but GYN prescribed it while she was pregnant. Pt wants to know if we can provide refills now?  Please advise.

## 2012-11-02 NOTE — Telephone Encounter (Signed)
Patient returned phone call and scheduled an appointment for early January/2014. [facility] [address] in [place]

## 2012-11-02 NOTE — Telephone Encounter (Signed)
Left detailed message on cell# re: instructions below and to call back to verify pharmacy and schedule f/u.

## 2012-11-02 NOTE — Telephone Encounter (Signed)
OK to give 30 day refill, but I would like to see her back in office to follow up on depression as I have not seen her for this before.

## 2012-11-03 MED ORDER — SERTRALINE HCL 25 MG PO TABS: 25.0000 mg | ORAL_TABLET | Freq: Every day | ORAL | Status: DC

## 2012-11-03 NOTE — Telephone Encounter (Signed)
Rx sent 

## 2012-11-25 ENCOUNTER — Encounter: Payer: Self-pay | Admitting: Family

## 2012-11-25 ENCOUNTER — Ambulatory Visit (INDEPENDENT_AMBULATORY_CARE_PROVIDER_SITE_OTHER): Payer: PRIVATE HEALTH INSURANCE | Admitting: Family

## 2012-11-25 VITALS — BP 92/70 | HR 62 | Temp 97.9°F | Resp 16 | Ht 64.0 in | Wt 166.0 lb

## 2012-11-25 DIAGNOSIS — Z23 Encounter for immunization: Secondary | ICD-10-CM

## 2012-11-25 DIAGNOSIS — F341 Dysthymic disorder: Secondary | ICD-10-CM

## 2012-11-25 MED ORDER — SERTRALINE HCL 25 MG PO TABS: 25.0000 mg | ORAL_TABLET | Freq: Every day | ORAL | Status: DC

## 2012-11-25 NOTE — Assessment & Plan Note (Signed)
Stable on sertraline 25.  Continue same.

## 2012-11-25 NOTE — Progress Notes (Signed)
  Subjective:    Patient ID: Autumn [name], female    DOB: 1976-03-15, 37 y.o.   MRN: [id number]  HPI  Autumn [name] is a 37 yr old female who presents today for follow up of her depression and anxiety.  She reports that she did not take the sertraline for some time and her husband "noticed a difference." She thinks that she is less irritable, more patient, when she takes it.  Describes overall feeling of "being overwhelmed."  Works 45+ hours a week and has 2 small children. She feels that her current dose of sertraline is adequate in helping her with her sense of being "overwhelmed."She wishes to continues and is requesting a refill.    Review of Systems See HPI  Past Medical History  Diagnosis Date  . Depression     History   Social History  . Marital Status: Married    Spouse Name: N/A    Number of Children: 1  . Years of Education: N/A   Occupational History  . TRAINING    Social History Main Topics  . Smoking status: Former Smoker    Quit date: 11/16/2005  . Smokeless tobacco: Never Used  . Alcohol Use: No  . Drug Use: Not on file  . Sexually Active: Not on file   Other Topics Concern  . Not on file   Social History Narrative  . No narrative on file    No past surgical history on file.  Family History  Problem Relation Age of Onset  . Aneurysm Neg Hx   . Polycystic kidney disease Neg Hx   . Depression Other   . Bipolar disorder Other     Allergies  Allergen Reactions  . Augmentin (Amoxicillin-Pot Clavulanate) Hives  . Erythromycin     REACTION: vomiting    Current Outpatient Prescriptions on File Prior to Visit  Medication Sig Dispense Refill  . ranitidine (ZANTAC) 150 MG capsule Take 1 capsule (150 mg total) by mouth 2 (two) times daily.  60 capsule  0  . sertraline (ZOLOFT) 25 MG tablet Take 1 tablet (25 mg total) by mouth daily.  30 tablet  5    BP 92/70  Pulse 62  Temp 97.9 F (36.6 C) (Oral)  Resp 16  Ht 5\' 4"  (1.626 m)  Wt 166 lb  (75.297 kg)  BMI 28.49 kg/m2  SpO2 99%       Objective:   Physical Exam  Constitutional: She is oriented to person, place, and time. She appears well-developed and well-nourished.  Neurological: She is alert and oriented to person, place, and time.  Psychiatric: She has a normal mood and affect. Her behavior is normal. Judgment and thought content normal.          Assessment & Plan:  15 minutes spent with pt today.  >50% of this time was spent counseling pt on her anxiety and depression.

## 2012-11-25 NOTE — Patient Instructions (Addendum)
Please follow-up in 3-6 months.

## 2012-12-05 ENCOUNTER — Ambulatory Visit (INDEPENDENT_AMBULATORY_CARE_PROVIDER_SITE_OTHER): Payer: PRIVATE HEALTH INSURANCE | Admitting: Family

## 2012-12-05 ENCOUNTER — Encounter: Payer: Self-pay | Admitting: Family

## 2012-12-05 VITALS — BP 120/70 | HR 69 | Temp 98.2°F | Resp 16 | Wt 164.0 lb

## 2012-12-05 DIAGNOSIS — J069 Acute upper respiratory infection, unspecified: Secondary | ICD-10-CM | POA: Insufficient documentation

## 2012-12-05 MED ORDER — CEFUROXIME AXETIL 500 MG PO TABS: 500.0000 mg | ORAL_TABLET | Freq: Two times a day (BID) | ORAL | Status: DC

## 2012-12-05 NOTE — Patient Instructions (Addendum)
If symptoms worsen, or if no improvement in 2-3 days, start antibiotics.

## 2012-12-05 NOTE — Assessment & Plan Note (Signed)
At this point,most likely viral. Rx provided for ceftin.  Recommended that pt start this only if symptoms worsen or if not improved in 2-3 days. She verbalizes understanding.

## 2012-12-05 NOTE — Progress Notes (Signed)
  Subjective:    Patient ID: Autumn [name], female    DOB: 1976-04-21, 37 y.o.   MRN: [id number]  HPI  [name] is a 37 yr old female who presents today with chief complaint of nasal congestion.   Symptoms started on 1/17.  She reports associated dizziness, sore throat, sinus pressure.  Reports pressure behind the right eye and that the "eyelid almost feels tender." Ears feel full.  Denies associated fever.  Review of Systems See HPI  Past Medical History  Diagnosis Date  . Depression     History   Social History  . Marital Status: Married    Spouse Name: N/A    Number of Children: 1  . Years of Education: N/A   Occupational History  . TRAINING    Social History Main Topics  . Smoking status: Former Smoker    Quit date: 11/16/2005  . Smokeless tobacco: Never Used  . Alcohol Use: No  . Drug Use: Not on file  . Sexually Active: Not on file   Other Topics Concern  . Not on file   Social History Narrative  . No narrative on file    No past surgical history on file.  Family History  Problem Relation Age of Onset  . Aneurysm Neg Hx   . Polycystic kidney disease Neg Hx   . Depression Other   . Bipolar disorder Other     Allergies  Allergen Reactions  . Augmentin (Amoxicillin-Pot Clavulanate) Hives  . Erythromycin     REACTION: vomiting    Current Outpatient Prescriptions on File Prior to Visit  Medication Sig Dispense Refill  . ranitidine (ZANTAC) 150 MG capsule Take 1 capsule (150 mg total) by mouth 2 (two) times daily.  60 capsule  0  . sertraline (ZOLOFT) 25 MG tablet Take 1 tablet (25 mg total) by mouth daily.  30 tablet  5    BP 120/70  Pulse 69  Temp 98.2 F (36.8 C) (Oral)  Resp 16  Wt 164 lb (74.39 kg)  SpO2 99%       Objective:   Physical Exam  Constitutional: She is oriented to person, place, and time. She appears well-developed and well-nourished. No distress.  HENT:  Head: Normocephalic and atraumatic.  Right Ear: Tympanic  membrane and ear canal normal.  Left Ear: Tympanic membrane and ear canal normal.  Mouth/Throat: No oropharyngeal exudate, posterior oropharyngeal edema or posterior oropharyngeal erythema.       No significant maxillary or frontal sinus tenderness to palpation.  Eyes: Conjunctivae normal are normal. Pupils are equal, round, and reactive to light. No scleral icterus.       No redness of swelling noted around eyes.   Neck: Normal range of motion. Neck supple.  Cardiovascular: Normal rate and regular rhythm.   No murmur heard. Pulmonary/Chest: Effort normal and breath sounds normal. No respiratory distress. She has no wheezes. She has no rales. She exhibits no tenderness.  Musculoskeletal: She exhibits no edema.  Lymphadenopathy:    She has no cervical adenopathy.  Neurological: She is alert and oriented to person, place, and time.  Skin: Skin is warm and dry.  Psychiatric: She has a normal mood and affect. Her behavior is normal. Judgment and thought content normal.          Assessment & Plan:

## 2013-05-10 ENCOUNTER — Encounter: Payer: Self-pay | Admitting: Family

## 2013-05-10 ENCOUNTER — Ambulatory Visit (INDEPENDENT_AMBULATORY_CARE_PROVIDER_SITE_OTHER): Payer: PRIVATE HEALTH INSURANCE | Admitting: Family

## 2013-05-10 VITALS — BP 102/70 | HR 69 | Temp 97.9°F | Resp 16 | Ht 64.0 in | Wt 166.0 lb

## 2013-05-10 DIAGNOSIS — J302 Other seasonal allergic rhinitis: Secondary | ICD-10-CM

## 2013-05-10 DIAGNOSIS — K130 Diseases of lips: Secondary | ICD-10-CM

## 2013-05-10 DIAGNOSIS — M79673 Pain in unspecified foot: Secondary | ICD-10-CM | POA: Insufficient documentation

## 2013-05-10 DIAGNOSIS — M25579 Pain in unspecified ankle and joints of unspecified foot: Secondary | ICD-10-CM

## 2013-05-10 DIAGNOSIS — T783XXA Angioneurotic edema, initial encounter: Secondary | ICD-10-CM | POA: Insufficient documentation

## 2013-05-10 DIAGNOSIS — R220 Localized swelling, mass and lump, head: Secondary | ICD-10-CM

## 2013-05-10 DIAGNOSIS — M25571 Pain in right ankle and joints of right foot: Secondary | ICD-10-CM

## 2013-05-10 DIAGNOSIS — J309 Allergic rhinitis, unspecified: Secondary | ICD-10-CM

## 2013-05-10 MED ORDER — EPINEPHRINE 0.3 MG/0.3ML IJ SOAJ: 0.3000 mg | Freq: Once | INTRAMUSCULAR | Status: DC

## 2013-05-10 NOTE — Patient Instructions (Addendum)
You will be contacted about your referral to the allergist.

## 2013-05-10 NOTE — Progress Notes (Signed)
  Subjective:    Patient ID: Autumn [name], female    DOB: 08-28-1976, 37 y.o.   MRN: [id number]  HPI  Reports that she had lip swelling for 36 hours following a dinner of salad and fried chicken. Took benadryl which calmed down the swelling, but did not resolve the lip swelling for 36 hours.  She reports that she had sensation of throat fullness and tightness with her breathing.  Son is allergic to eggs.    Dizziness- had cold about 3 weeks ago.  Reports that she is "really tired."  Denies current sinus pain. Has some mild nasal congestion.     Foot pain- reports that this has been intermittent x 3 months.  Pain is overlying the right 4th metatarsal. Mild improvement with motrin.    Review of Systems See HPI  Past Medical History  Diagnosis Date  . Depression     History   Social History  . Marital Status: Married    Spouse Name: N/A    Number of Children: 1  . Years of Education: N/A   Occupational History  . TRAINING    Social History Main Topics  . Smoking status: Former Smoker    Quit date: 11/16/2005  . Smokeless tobacco: Never Used  . Alcohol Use: No  . Drug Use: Not on file  . Sexually Active: Not on file   Other Topics Concern  . Not on file   Social History Narrative  . No narrative on file    No past surgical history on file.  Family History  Problem Relation Age of Onset  . Aneurysm Neg Hx   . Polycystic kidney disease Neg Hx   . Depression Other   . Bipolar disorder Other     Allergies  Allergen Reactions  . Augmentin (Amoxicillin-Pot Clavulanate) Hives  . Erythromycin     REACTION: vomiting    Current Outpatient Prescriptions on File Prior to Visit  Medication Sig Dispense Refill  . sertraline (ZOLOFT) 25 MG tablet Take 1 tablet (25 mg total) by mouth daily.  30 tablet  5   No current facility-administered medications on file prior to visit.    BP 102/70  Pulse 69  Temp(Src) 97.9 F (36.6 C) (Oral)  Resp 16  Ht 5\' 4"  (1.626  m)  Wt 166 lb (75.297 kg)  BMI 28.48 kg/m2  SpO2 99%       Objective:   Physical Exam  Constitutional: She is oriented to person, place, and time. She appears well-developed and well-nourished. No distress.  HENT:  Head: Normocephalic and atraumatic.  Right Ear: Tympanic membrane and ear canal normal.  Left Ear: Tympanic membrane and ear canal normal.  Mouth/Throat: No oropharyngeal exudate, posterior oropharyngeal edema or posterior oropharyngeal erythema.  No tongue/lip swelling is noted  Cardiovascular: Normal rate and regular rhythm.   No murmur heard. Pulmonary/Chest: Effort normal and breath sounds normal. No respiratory distress. She has no wheezes. She has no rales. She exhibits no tenderness.  Neurological: She is alert and oriented to person, place, and time.  Psychiatric: She has a normal mood and affect. Her behavior is normal. Judgment and thought content normal.          Assessment & Plan:

## 2013-05-10 NOTE — Assessment & Plan Note (Signed)
This is resolved.  I have advised pt to start claritin D (to help with allergic rhinitis/dizziness and potential food allergy).  Recommended that she avoid chicken until evaluated by an Allergist (referral placed).  An rx for an epi pen was provided today along with instructions for use.

## 2013-05-10 NOTE — Assessment & Plan Note (Signed)
She has had previous frx in this foot. Could be arthritis.  Recommended prn tylenol. We discussed a possible x ray but she declines further work up at this time.

## 2013-05-10 NOTE — Assessment & Plan Note (Signed)
Recommended claritin d x 1 week, then try transitioning to plain claritin.

## 2013-07-31 ENCOUNTER — Other Ambulatory Visit: Payer: Self-pay | Admitting: Family

## 2013-09-29 ENCOUNTER — Encounter (INDEPENDENT_AMBULATORY_CARE_PROVIDER_SITE_OTHER): Payer: Self-pay

## 2013-09-29 ENCOUNTER — Encounter: Payer: Self-pay | Admitting: Family

## 2013-09-29 ENCOUNTER — Ambulatory Visit (INDEPENDENT_AMBULATORY_CARE_PROVIDER_SITE_OTHER): Payer: PRIVATE HEALTH INSURANCE | Admitting: Family

## 2013-09-29 ENCOUNTER — Ambulatory Visit (HOSPITAL_BASED_OUTPATIENT_CLINIC_OR_DEPARTMENT_OTHER)
Admission: RE | Admit: 2013-09-29 | Discharge: 2013-09-29 | Disposition: A | Discharge status: Home / Self Care | Payer: PRIVATE HEALTH INSURANCE | Source: Ambulatory Visit | Attending: Family | Admitting: Family

## 2013-09-29 DIAGNOSIS — M79609 Pain in unspecified limb: Secondary | ICD-10-CM | POA: Insufficient documentation

## 2013-09-29 DIAGNOSIS — Z23 Encounter for immunization: Secondary | ICD-10-CM

## 2013-09-29 DIAGNOSIS — F341 Dysthymic disorder: Secondary | ICD-10-CM

## 2013-09-29 MED ORDER — SERTRALINE HCL 50 MG PO TABS: 50.0000 mg | ORAL_TABLET | Freq: Every day | ORAL | Status: DC

## 2013-09-29 NOTE — Assessment & Plan Note (Signed)
Stable on 50mg  dose of zoloft.  Continue same.

## 2013-09-29 NOTE — Assessment & Plan Note (Addendum)
Refer to sports medicine, obtain x ray right foot.  Advised leve 220mg  twice daily as needed short term for pain. Then switch to tylenol as needed.

## 2013-09-29 NOTE — Progress Notes (Signed)
  Subjective:    Patient ID: Autumn [name], female    DOB: 1976/07/12, 37 y.o.   MRN: [id number]  HPI  Autumn [name] is a 38 yr old female who presents today for follow up of her depression. She reports that for a while she felt a little down. She increased zoloft from 25mg  to 50mg . Feels better on this dose of zoloft.  Would like to continue the 50mg  dose. Trying to exercise more.      She is also concerned about pain in her right foot.  She feels like she is walking differently.  Reports improvement with ice.  Feels like she is pronating her right foot to compensate.  As a result her right knee is starting to bother her.  She has stopped wearing high heels.  Unable to run as she would like due to pain.  Also reports some pain in the right dorsal hand which is worse at the end of the day.  Types a lot at work.   Review of Systems See HPI  Past Medical History  Diagnosis Date  . Depression     History   Social History  . Marital Status: Married    Spouse Name: N/A    Number of Children: 1  . Years of Education: N/A   Occupational History  . TRAINING    Social History Main Topics  . Smoking status: Former Smoker    Quit date: 11/16/2005  . Smokeless tobacco: Never Used  . Alcohol Use: No  . Drug Use: Not on file  . Sexual Activity: Not on file   Other Topics Concern  . Not on file   Social History Narrative  . No narrative on file    No past surgical history on file.  Family History  Problem Relation Age of Onset  . Aneurysm Neg Hx   . Polycystic kidney disease Neg Hx   . Depression Other   . Bipolar disorder Other     Allergies  Allergen Reactions  . Augmentin [Amoxicillin-Pot Clavulanate] Hives  . Erythromycin     REACTION: vomiting    Current Outpatient Prescriptions on File Prior to Visit  Medication Sig Dispense Refill  . EPINEPHrine (EPI-PEN) 0.3 mg/0.3 mL DEVI Inject 0.3 mLs (0.3 mg total) into the muscle once.  2 Device  0  . ranitidine  (ZANTAC) 150 MG capsule Take 150 mg by mouth 2 (two) times daily as needed.      . sertraline (ZOLOFT) 25 MG tablet TAKE 1 TABLET (25 MG TOTAL) BY MOUTH DAILY.  30 tablet  3   No current facility-administered medications on file prior to visit.    BP 100/70  Pulse 73  Temp(Src) 98 F (36.7 C) (Oral)  Resp 16  Ht 5\' 4"  (1.626 m)  Wt 163 lb 1.3 oz (73.973 kg)  BMI 27.98 kg/m2  SpO2 99%       Objective:   Physical Exam  Constitutional: She appears well-developed and well-nourished. No distress.  Cardiovascular: Normal rate and regular rhythm.   Pulmonary/Chest: Effort normal and breath sounds normal. No respiratory distress. She has no wheezes. She has no rales. She exhibits no tenderness.  Musculoskeletal:  R dorsal foot is without swelling.  Mild swelling of right hand at base of 3rd and 4th fingers.   Psychiatric: She has a normal mood and affect. Her behavior is normal. Judgment and thought content normal.          Assessment & Plan:

## 2013-09-29 NOTE — Patient Instructions (Signed)
Please complete your x ray on the first floor prior to leaving. You may use aleve 220mg  twice daily as needed short term for pain. Then switch to tylenol as needed. You will be contacted about your referral to Sports medicine. Follow up in 6 months.

## 2013-10-03 ENCOUNTER — Ambulatory Visit (INDEPENDENT_AMBULATORY_CARE_PROVIDER_SITE_OTHER): Payer: PRIVATE HEALTH INSURANCE | Admitting: Family Medicine

## 2013-10-03 ENCOUNTER — Encounter: Payer: Self-pay | Admitting: Family Medicine

## 2013-10-03 DIAGNOSIS — M79609 Pain in unspecified limb: Secondary | ICD-10-CM

## 2013-10-03 NOTE — Patient Instructions (Signed)
Your x-rays, ultrasound of foot are reassuring. 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 These can be frustrating because they usually take months to resolve. Icing 15 minutes at a time 3-4 times a day. Ibuprofen 600mg  three times a day for pain and inflammation as needed with food. Theraband foot extension exercise, toe exercise with towel or picking up marbles - 3 sets of 10 once a day of each of these for 6 weeks. Consider formal physical therapy. Dr. Jari Sportsman active series or Superfeet insoles for better arch support.

## 2013-10-04 ENCOUNTER — Encounter: Payer: Self-pay | Admitting: Family Medicine

## 2013-10-04 NOTE — Progress Notes (Signed)
Patient ID: Autumn [name], female   DOB: 04/11/1976, 37 y.o.   MRN: [id number]  PCP: Lemont Fillers., NP  Subjective:   HPI: Patient is a 37 y.o. female here for right foot pain.  Patient denies known injury. States for past 8 months she has had off and on right foot pain. Will come on for several weeks then go away. Ran in flip flops about 2 weeks ago and it hurt - this was the last time. Icing helps and is taking ibuprofen. Worse with jumping. Feels primarily on top of foot.  Past Medical History  Diagnosis Date  . Depression     Current Outpatient Prescriptions on File Prior to Visit  Medication Sig Dispense Refill  . EPINEPHrine (EPI-PEN) 0.3 mg/0.3 mL DEVI Inject 0.3 mLs (0.3 mg total) into the muscle once.  2 Device  0  . ranitidine (ZANTAC) 150 MG capsule Take 150 mg by mouth 2 (two) times daily as needed.      . sertraline (ZOLOFT) 50 MG tablet Take 1 tablet (50 mg total) by mouth daily.  30 tablet  5   No current facility-administered medications on file prior to visit.    Past Surgical History  Procedure Laterality Date  . Tonsillectomy      Allergies  Allergen Reactions  . Augmentin [Amoxicillin-Pot Clavulanate] Hives  . Erythromycin     REACTION: vomiting    History   Social History  . Marital Status: Married    Spouse Name: N/A    Number of Children: 1  . Years of Education: N/A   Occupational History  . TRAINING    Social History Main Topics  . Smoking status: Former Smoker    Quit date: 11/16/2005  . Smokeless tobacco: Never Used  . Alcohol Use: No  . Drug Use: Not on file  . Sexual Activity: Not on file   Other Topics Concern  . Not on file   Social History Narrative  . No narrative on file    Family History  Problem Relation Age of Onset  . Aneurysm Neg Hx   . Polycystic kidney disease Neg Hx   . Hyperlipidemia Neg Hx   . Heart attack Neg Hx   . Diabetes Neg Hx   . Depression Other   . Bipolar disorder Other   .  Hypertension Mother     BP 101/65  Pulse 75  Ht 5\' 5"  (1.651 m)  Wt 160 lb (72.576 kg)  BMI 26.63 kg/m2  Review of Systems: See HPI above.    Objective:  Physical Exam:  Gen: NAD  Right foot/ankle: No gross deformity, swelling, ecchymoses FROM with pain on extension of all digits, mostly lateral foot. TTP extensor tendons of 4th and 5th digits.  No focal bony tenderness. Negative ant drawer and talar tilt.   Negative syndesmotic compression. Thompsons test negative. Negative hop test NV intact distally.  MSK u/s: target sign of extensor digitorum to 5th digit.  No bony irregularity, edema overlying cortices, neovascularity of right foot.    Assessment & Plan:  1. Right foot pain - Radiographs, ultrasound reassuring.  Consistent with extensor tendinopathy.  Icing, nsaids, home exercise program reviewed.  Consider formal PT if not improving.  Activities as tolerated.  Consider Dr. Jari Sportsman active series.  F/u prn.

## 2013-10-04 NOTE — Assessment & Plan Note (Signed)
Radiographs, ultrasound reassuring.  Consistent with extensor tendinopathy.  Icing, nsaids, home exercise program reviewed.  Consider formal PT if not improving.  Activities as tolerated.  Consider Dr. Jari Sportsman active series.  F/u prn.

## 2014-04-11 ENCOUNTER — Telehealth: Payer: Self-pay | Admitting: *Deleted

## 2014-04-11 NOTE — Telephone Encounter (Signed)
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 Wants to know if we could call in some alprazolam to use for her flights like we have done in the past.  Please advise.

## 2014-04-12 MED ORDER — ALPRAZOLAM 0.5 MG PO TABS: ORAL_TABLET | ORAL | Status: DC

## 2014-04-12 NOTE — Telephone Encounter (Signed)
Rx request faxed to pharmacy/[facility]  

## 2014-04-12 NOTE — Telephone Encounter (Signed)
OK to call in alprazolam as pended below.

## 2014-04-13 NOTE — Telephone Encounter (Signed)
Pt left message requesting status of Rx. Advised her rx was faxed as per below documentation and to call me back if the pharmacy does not have Rx.

## 2014-05-10 ENCOUNTER — Other Ambulatory Visit: Payer: Self-pay | Admitting: Family

## 2014-05-10 NOTE — Telephone Encounter (Signed)
Refill request for sertraline Last filled by MD on- 09/29/2013 #30 x5 Last Appt: 09/29/13 Next Appt: none Please advise refill?

## 2014-05-11 NOTE — Telephone Encounter (Signed)
30 tabs sent but pt is due for follow up please.

## 2014-05-11 NOTE — Telephone Encounter (Signed)
Please call pt to arrange appointment before further refills due.

## 2014-05-14 NOTE — Telephone Encounter (Signed)
Left detailed message informing patient of medication refill and that she is due for an appointment

## 2014-05-15 NOTE — Telephone Encounter (Signed)
Left message for patient to return my call.

## 2014-05-16 NOTE — Telephone Encounter (Signed)
Left message for patient to return my call.

## 2014-06-23 ENCOUNTER — Ambulatory Visit: Payer: PRIVATE HEALTH INSURANCE | Admitting: Family Medicine

## 2014-06-30 ENCOUNTER — Other Ambulatory Visit: Payer: Self-pay | Admitting: Family

## 2014-07-01 NOTE — Telephone Encounter (Signed)
Please contact pt to schedule follow up. Rx has been sent.

## 2014-07-03 NOTE — Telephone Encounter (Signed)
Left detailed message informing patient of medication refill and that she is due for a follow up.

## 2014-07-04 NOTE — Telephone Encounter (Signed)
Left message for patient to return my call.

## 2014-07-06 NOTE — Telephone Encounter (Signed)
Left message for patient to return my call.

## 2014-07-27 ENCOUNTER — Encounter: Payer: Self-pay | Admitting: Family

## 2014-07-27 ENCOUNTER — Ambulatory Visit (INDEPENDENT_AMBULATORY_CARE_PROVIDER_SITE_OTHER): Payer: PRIVATE HEALTH INSURANCE | Admitting: Family

## 2014-07-27 VITALS — BP 98/60 | HR 78 | Temp 98.1°F | Resp 16 | Ht 64.0 in | Wt 159.6 lb

## 2014-07-27 DIAGNOSIS — H109 Unspecified conjunctivitis: Secondary | ICD-10-CM | POA: Insufficient documentation

## 2014-07-27 DIAGNOSIS — Z23 Encounter for immunization: Secondary | ICD-10-CM

## 2014-07-27 DIAGNOSIS — F341 Dysthymic disorder: Secondary | ICD-10-CM

## 2014-07-27 MED ORDER — CIPROFLOXACIN HCL 0.3 % OP SOLN: 2.0000 [drp] | OPHTHALMIC | Status: DC

## 2014-07-27 MED ORDER — SERTRALINE HCL 50 MG PO TABS: ORAL_TABLET | ORAL | Status: DC

## 2014-07-27 NOTE — Assessment & Plan Note (Signed)
Mild. Will rx with ciloxan eye drops.

## 2014-07-27 NOTE — Patient Instructions (Addendum)
Please schedule a fasting physical at the front desk.

## 2014-07-27 NOTE — Progress Notes (Signed)
Subjective:    Patient ID: Autumn [name], female    DOB: 1976-01-14, 38 y.o.   MRN: [id number]  HPI  Autumn [name] is a 38 yr old female who presents today for follow up:  1) Depression/anxiety- She is currently maintained on sertraline.  Feels stressed. Changed jobs 2 weeks ago.  Working at [employer] in [employer]. Only uses xanax on airplanes.  Feels like her current issue with stress anxiety is situational due to job change and recent lack of exercise. Feels like things will calm down once she is back into a routine.   2) Eye irritation- last night she developed some eye itching and woke up with crusting/pinkness. Her son has "pink eye."    Review of Systems See HPI  Past Medical History  Diagnosis Date  . Depression     History   Social History  . Marital Status: Married    Spouse Name: N/A    Number of Children: 1  . Years of Education: N/A   Occupational History  . TRAINING    Social History Main Topics  . Smoking status: Former Smoker    Quit date: 11/16/2005  . Smokeless tobacco: Never Used  . Alcohol Use: No  . Drug Use: Not on file  . Sexual Activity: Not on file   Other Topics Concern  . Not on file   Social History Narrative  . No narrative on file    Past Surgical History  Procedure Laterality Date  . Tonsillectomy      Family History  Problem Relation Age of Onset  . Aneurysm Neg Hx   . Polycystic kidney disease Neg Hx   . Hyperlipidemia Neg Hx   . Heart attack Neg Hx   . Diabetes Neg Hx   . Depression Other   . Bipolar disorder Other   . Hypertension Mother     Allergies  Allergen Reactions  . Augmentin [Amoxicillin-Pot Clavulanate] Hives  . Erythromycin     REACTION: vomiting    Current Outpatient Prescriptions on File Prior to Visit  Medication Sig Dispense Refill  . ALPRAZolam (XANAX) 0.5 MG tablet One tab by mouth 30 minutes prior to flying  6 tablet  0  . EPINEPHrine (EPI-PEN) 0.3 mg/0.3 mL DEVI Inject 0.3 mLs (0.3 mg  total) into the muscle once.  2 Device  0  . ranitidine (ZANTAC) 150 MG capsule Take 150 mg by mouth 2 (two) times daily as needed.      . sertraline (ZOLOFT) 50 MG tablet TAKE 1 TABLET (50 MG TOTAL) BY MOUTH DAILY.  30 tablet  0   No current facility-administered medications on file prior to visit.    BP 98/60  Pulse 78  Temp(Src) 98.1 F (36.7 C) (Oral)  Resp 16  Ht 5\' 4"  (1.626 m)  Wt 159 lb 9.6 oz (72.394 kg)  BMI 27.38 kg/m2  SpO2 99%       Objective:   Physical Exam  Constitutional: She is oriented to person, place, and time. She appears well-developed and well-nourished. No distress.  HENT:  Head: Normocephalic and atraumatic.  Right Ear: Tympanic membrane and ear canal normal.  Left Ear: Tympanic membrane and ear canal normal.  Mouth/Throat: No oropharyngeal exudate, posterior oropharyngeal edema or posterior oropharyngeal erythema.  Eyes:  Mildly pink sclera noted bilaterally without significant crusting  Cardiovascular: Normal rate and regular rhythm.   No murmur heard. Pulmonary/Chest: Effort normal and breath sounds normal. No respiratory distress. She has no wheezes. She  has no rales. She exhibits no tenderness.  Musculoskeletal: She exhibits no edema.  Neurological: She is alert and oriented to person, place, and time.  Psychiatric: She has a normal mood and affect. Her behavior is normal. Judgment and thought content normal.          Assessment & Plan:

## 2014-07-27 NOTE — Progress Notes (Signed)
Pre visit review using our clinic review tool, if applicable. No additional management support is needed unless otherwise documented below in the visit note. 

## 2014-07-27 NOTE — Assessment & Plan Note (Signed)
Fair control- reports recent increased stress. She wishes to continue current meds and see how things go which I think is reasonable.

## 2014-09-14 ENCOUNTER — Ambulatory Visit (INDEPENDENT_AMBULATORY_CARE_PROVIDER_SITE_OTHER): Payer: PRIVATE HEALTH INSURANCE | Admitting: Family

## 2014-09-14 ENCOUNTER — Encounter: Payer: Self-pay | Admitting: Family

## 2014-09-14 VITALS — BP 106/64 | HR 72 | Temp 98.3°F | Resp 16 | Ht 64.0 in | Wt 160.8 lb

## 2014-09-14 DIAGNOSIS — Z0000 Encounter for general adult medical examination without abnormal findings: Secondary | ICD-10-CM | POA: Insufficient documentation

## 2014-09-14 DIAGNOSIS — D229 Melanocytic nevi, unspecified: Secondary | ICD-10-CM

## 2014-09-14 LAB — URINALYSIS, ROUTINE W REFLEX MICROSCOPIC
Bilirubin Urine: NEGATIVE
HGB URINE DIPSTICK: NEGATIVE
KETONES UR: NEGATIVE
Leukocytes, UA: NEGATIVE
Nitrite: NEGATIVE
RBC / HPF: NONE SEEN (ref 0–?)
Specific Gravity, Urine: 1.015 (ref 1.000–1.030)
TOTAL PROTEIN, URINE-UPE24: NEGATIVE
URINE GLUCOSE: NEGATIVE
Urobilinogen, UA: 0.2 (ref 0.0–1.0)
WBC UA: NONE SEEN (ref 0–?)
pH: 7.5 (ref 5.0–8.0)

## 2014-09-14 LAB — BASIC METABOLIC PANEL
BUN: 19 mg/dL (ref 6–23)
CHLORIDE: 103 meq/L (ref 96–112)
CO2: 27 meq/L (ref 19–32)
Calcium: 9.2 mg/dL (ref 8.4–10.5)
Creatinine, Ser: 0.8 mg/dL (ref 0.4–1.2)
GFR: 83.76 mL/min (ref >60.00)
GLUCOSE: 91 mg/dL (ref 70–99)
POTASSIUM: 5 meq/L (ref 3.5–5.1)
SODIUM: 137 meq/L (ref 135–145)

## 2014-09-14 LAB — CBC WITH DIFFERENTIAL/PLATELET
BASOS ABS: 0 10*3/uL (ref 0.0–0.1)
Basophils Relative: 0.3 % (ref 0.0–3.0)
EOS ABS: 0.2 10*3/uL (ref 0.0–0.7)
Eosinophils Relative: 3.8 % (ref 0.0–5.0)
HEMATOCRIT: 40.8 % (ref 36.0–46.0)
HEMOGLOBIN: 13.9 g/dL (ref 12.0–15.0)
LYMPHS ABS: 1.7 10*3/uL (ref 0.7–4.0)
Lymphocytes Relative: 36.3 % (ref 12.0–46.0)
MCHC: 34 g/dL (ref 30.0–36.0)
MCV: 91.6 fl (ref 78.0–100.0)
Monocytes Absolute: 0.3 10*3/uL (ref 0.1–1.0)
Monocytes Relative: 7.1 % (ref 3.0–12.0)
NEUTROS ABS: 2.5 10*3/uL (ref 1.4–7.7)
Neutrophils Relative %: 52.5 % (ref 43.0–77.0)
Platelets: 215 10*3/uL (ref 150.0–400.0)
RBC: 4.46 Mil/uL (ref 3.87–5.11)
RDW: 12.8 % (ref 11.5–15.5)
WBC: 4.7 10*3/uL (ref 4.0–10.5)

## 2014-09-14 LAB — HEPATIC FUNCTION PANEL
ALT: 15 U/L (ref 0–35)
AST: 22 U/L (ref 0–37)
Albumin: 3.9 g/dL (ref 3.5–5.2)
Alkaline Phosphatase: 38 U/L — ABNORMAL LOW (ref 39–117)
Bilirubin, Direct: 0.1 mg/dL (ref 0.0–0.3)
Total Bilirubin: 1 mg/dL (ref 0.2–1.2)
Total Protein: 7.2 g/dL (ref 6.0–8.3)

## 2014-09-14 LAB — LIPID PANEL
CHOLESTEROL: 199 mg/dL (ref 0–200)
HDL: 71.4 mg/dL (ref >39.00)
LDL CALC: 116 mg/dL — AB (ref 0–99)
NonHDL: 127.6
TRIGLYCERIDES: 57 mg/dL (ref 0.0–149.0)
Total CHOL/HDL Ratio: 3
VLDL: 11.4 mg/dL (ref 0.0–40.0)

## 2014-09-14 LAB — TSH: TSH: 1.8 u[IU]/mL (ref 0.35–4.50)

## 2014-09-14 NOTE — Progress Notes (Signed)
Pre visit review using our clinic review tool, if applicable. No additional management support is needed unless otherwise documented below in the visit note. 

## 2014-09-14 NOTE — Patient Instructions (Addendum)
Please complete lab work prior to leaving. Schedule follow up with Dental and GYN. You will be contacted about your referral to dermatology.  Follow up in 6 months.

## 2014-09-14 NOTE — Progress Notes (Signed)
   Subjective:    Patient ID: Autumn [name], female    DOB: 07-26-1976, 38 y.o.   MRN: [id number]  HPI Autumn [name] is a 38 year old female who presents today for a physical.  Diet- Typically eats smoothies or oatmeal for breakfast.  Doesn't eat out very often. Cooks at home often.   Exercise- Currently not following an exercise regimen, due to switching jobs.  Wants to begin soon.    Eye exam/Dentist-Last eye exam was last year and she wears glasses for reading and computer work.  Last dental exam was over a year ago.  She will follow up and make an appointment.    Immunizations-Up to date.  Last Tdap was 2009, already received flu shot this year.    Preventative- Does not currently do self breast exam.  Says she not sure how to preform them. Will complete education today.  Last pap smear was in 2014, with normal result.        Review of Systems  Constitutional: Negative for activity change, appetite change and fatigue.  HENT: Negative.   Eyes: Negative.   Respiratory: Negative for cough, chest tightness and shortness of breath.   Cardiovascular: Negative for chest pain, palpitations and leg swelling.  Gastrointestinal: Negative.   Endocrine: Negative.   Genitourinary: Negative.   Musculoskeletal: Negative.   Neurological: Negative.   Psychiatric/Behavioral: Negative.        Objective:   Physical Exam  Constitutional: She is oriented to person, place, and time. She appears well-developed and well-nourished.  HENT:  Head: Normocephalic and atraumatic.  Right Ear: External ear normal.  Left Ear: External ear normal.  Nose: Nose normal.  Mouth/Throat: Oropharynx is clear and moist.  Eyes: Conjunctivae and EOM are normal. Pupils are equal, round, and reactive to light.  Neck: Normal range of motion. Neck supple. No thyromegaly present.  Cardiovascular: Normal rate and intact distal pulses.   No murmur heard. Pulmonary/Chest: Effort normal and breath sounds normal. No  respiratory distress.  Breast exam normal bilaterally, no masses noted, no axillary LAD  Abdominal: Soft. Bowel sounds are normal. There is no tenderness.  Musculoskeletal: Normal range of motion. She exhibits no edema.  Neurological: She is alert and oriented to person, place, and time.  Skin: Skin is warm and dry.     Psychiatric: She has a normal mood and affect.          Assessment & Plan:  Will do BMET, CBC, lipid panel, TSH, hepatic function and UA today. Will set up with dermatology for mole removal.  I have personally seen and examined patient and agree with Jettie Booze NP student's assessment and plan.

## 2014-09-14 NOTE — Assessment & Plan Note (Signed)
Continue healthy diet, increase exercise, pt to arrange follow up with GYN/dental. Discussed BSE.  Refer to derm for evaluation of irregular mole on back.

## 2014-09-15 ENCOUNTER — Encounter: Payer: Self-pay | Admitting: Family

## 2014-09-17 ENCOUNTER — Encounter: Payer: Self-pay | Admitting: Family

## 2014-09-19 ENCOUNTER — Encounter: Payer: Self-pay | Admitting: Family

## 2014-09-20 ENCOUNTER — Ambulatory Visit (INDEPENDENT_AMBULATORY_CARE_PROVIDER_SITE_OTHER): Payer: PRIVATE HEALTH INSURANCE | Admitting: Medical

## 2014-09-20 ENCOUNTER — Encounter: Payer: Self-pay | Admitting: Medical

## 2014-09-20 VITALS — BP 107/68 | HR 76 | Temp 98.4°F | Ht 64.0 in | Wt 162.0 lb

## 2014-09-20 DIAGNOSIS — B029 Zoster without complications: Secondary | ICD-10-CM

## 2014-09-20 DIAGNOSIS — J209 Acute bronchitis, unspecified: Secondary | ICD-10-CM

## 2014-09-20 MED ORDER — DOXYCYCLINE HYCLATE 100 MG PO TABS: 100.0000 mg | ORAL_TABLET | Freq: Two times a day (BID) | ORAL | Status: DC

## 2014-09-20 MED ORDER — FAMCICLOVIR 500 MG PO TABS: 500.0000 mg | ORAL_TABLET | Freq: Three times a day (TID) | ORAL | Status: DC

## 2014-09-20 MED ORDER — BENZONATATE 100 MG PO CAPS: 100.0000 mg | ORAL_CAPSULE | Freq: Three times a day (TID) | ORAL | Status: DC | PRN

## 2014-09-20 NOTE — Assessment & Plan Note (Signed)
For your bronchitis with possible sinusitis, I am prescribing cefdinir antibiotic. For the cough, I am prescribing benzonatate. For the associated nasal congestion you can get flonase otc.

## 2014-09-20 NOTE — Progress Notes (Signed)
Subjective:    Patient ID: Autumn [name], female    DOB: Nov 13, 1976, 38 y.o.   MRN: [id number]  HPI   Pt in with head congestion with some productive cough. Also has some chest congestion. Her son is sick with recent uri with bilateral om.   Sinus pressure as well.  Also rt shoulder upper chest started to itch on Friday. She started to scratch area Sunday or Monday. No painful to touch. Pt has some pain rt shooting toward. Pt describes one small vesicle that did rupture.   Extreme stress recently.   LMP- iud. Mirena.  Past Medical History  Diagnosis Date  . Depression     History   Social History  . Marital Status: Married    Spouse Name: N/A    Number of Children: 1  . Years of Education: N/A   Occupational History  . TRAINING    Social History Main Topics  . Smoking status: Former Smoker    Quit date: 11/16/2005  . Smokeless tobacco: Never Used  . Alcohol Use: No  . Drug Use: Not on file  . Sexual Activity: Not on file   Other Topics Concern  . Not on file   Social History Narrative    Past Surgical History  Procedure Laterality Date  . Tonsillectomy      Family History  Problem Relation Age of Onset  . Aneurysm Neg Hx   . Polycystic kidney disease Neg Hx   . Hyperlipidemia Neg Hx   . Heart attack Neg Hx   . Diabetes Neg Hx   . Depression Other   . Bipolar disorder Other   . Hypertension Mother     Allergies  Allergen Reactions  . Augmentin [Amoxicillin-Pot Clavulanate] Hives  . Erythromycin     REACTION: vomiting    Current Outpatient Prescriptions on File Prior to Visit  Medication Sig Dispense Refill  . ALPRAZolam (XANAX) 0.5 MG tablet One tab by mouth 30 minutes prior to flying 6 tablet 0  . EPINEPHrine (EPI-PEN) 0.3 mg/0.3 mL DEVI Inject 0.3 mLs (0.3 mg total) into the muscle once. 2 Device 0  . sertraline (ZOLOFT) 50 MG tablet TAKE 1 TABLET (50 MG TOTAL) BY MOUTH DAILY. 30 tablet 5   No current facility-administered  medications on file prior to visit.    BP 107/68 mmHg  Pulse 76  Temp(Src) 98.4 F (36.9 C) (Oral)  Ht 5\' 4"  (1.626 m)  Wt 162 lb (73.483 kg)  BMI 27.79 kg/m2  SpO2 97%      Review of Systems  Constitutional: Negative for fever, chills and fatigue.  HENT: Positive for congestion and sinus pressure. Negative for ear discharge, ear pain, nosebleeds, postnasal drip, rhinorrhea, sore throat and tinnitus.   Respiratory: Positive for cough. Negative for chest tightness, shortness of breath and wheezing.        Productive  Cough.  Cardiovascular: Negative for chest pain and palpitations.  Gastrointestinal: Negative.   Endocrine: Negative for polydipsia, polyphagia and polyuria.  Musculoskeletal: Negative for back pain.  Skin:       Rt upper chest one small vesicle with slight itch and radiating pain at times to her back.  Neurological: Negative.   Hematological: Negative for adenopathy. Does not bruise/bleed easily.  Psychiatric/Behavioral: Negative.        Only recent high level stress.       Objective:   Physical Exam   General  Mental Status - Alert. General Appearance - Well groomed. Not in  acute distress.  Skin Rashes- . Rt upper chest/anterior to shoulder small hyperpigmented area where previous vesicle was. Mild tender to palpation.  HEENT Head- Normal. Ear Auditory Canal - Left- Normal. Right - Normal.Tympanic Membrane- Left- Normal. Right- Normal. Eye Sclera/Conjunctiva- Left- Normal. Right- Normal. Nose & Sinuses Nasal Mucosa- Left-  Boggy + Congested. Right-  Boggy + Congested. Mouth & Throat Lips: Upper Lip- Normal: no dryness, cracking, pallor, cyanosis, or vesicular eruption. Lower Lip-Normal: no dryness, cracking, pallor, cyanosis or vesicular eruption. Buccal Mucosa- Bilateral- No Aphthous ulcers. Oropharynx- No Discharge or Erythema. Tonsils: Characteristics- Bilateral- No Erythema or Congestion. Size/Enlargement- Bilateral- No enlargement. Discharge-  bilateral-None.  Neck Neck- Supple. No Masses.   Chest and Lung Exam Auscultation: Breath Sounds:-Normal. CTA.  Cardiovascular Auscultation:Rythm- Regular, rate and rhythm.  Murmurs & Other Heart Sounds:Ausculatation of the heart reveal- No Murmurs.  Lymphatic Head & Neck General Head & Neck Lymphatics: Bilateral: Description- No Localized lymphadenopathy.          Assessment & Plan:

## 2014-09-20 NOTE — Patient Instructions (Addendum)
For your bronchitis with possible sinusitis, I am prescribing cefdinir antibiotic. For the cough, I am prescribing benzonatate. For the associated nasal congestion you can get flonase otc.  On your rt upper chest, you may have had low grade eruption of vesicle with radiating pain on nerve. I think it is best to go ahead and treat your with famvir. Also you may have secondary infection from the itching. Doxycycline antibiotic should help with this as well.  Follow up in 7 days or as needed.  Shingles Shingles (herpes zoster) is an infection that is caused by the same virus that causes chickenpox (varicella). The infection causes a painful skin rash and fluid-filled blisters, which eventually break open, crust over, and heal. It may occur in any area of the body, but it usually affects only one side of the body or face. The pain of shingles usually lasts about 1 month. However, some people with shingles may develop long-term (chronic) pain in the affected area of the body. Shingles often occurs many years after the person had chickenpox. It is more common:  In people older than 50 years.  In people with weakened immune systems, such as those with HIV, AIDS, or cancer.  In people taking medicines that weaken the immune system, such as transplant medicines.  In people under great stress. CAUSES  Shingles is caused by the varicella zoster virus (VZV), which also causes chickenpox. After a person is infected with the virus, it can remain in the person's body for years in an inactive state (dormant). To cause shingles, the virus reactivates and breaks out as an infection in a nerve root. The virus can be spread from person to person (contagious) through contact with open blisters of the shingles rash. It will only spread to people who have not had chickenpox. When these people are exposed to the virus, they may develop chickenpox. They will not develop shingles. Once the blisters scab over, the person is  no longer contagious and cannot spread the virus to others. SIGNS AND SYMPTOMS  Shingles shows up in stages. The initial symptoms may be pain, itching, and tingling in an area of the skin. This pain is usually described as burning, stabbing, or throbbing.In a few days or weeks, a painful red rash will appear in the area where the pain, itching, and tingling were felt. The rash is usually on one side of the body in a band or belt-like pattern. Then, the rash usually turns into fluid-filled blisters. They will scab over and dry up in approximately 2-3 weeks. Flu-like symptoms may also occur with the initial symptoms, the rash, or the blisters. These may include:  Fever.  Chills.  Headache.  Upset stomach. DIAGNOSIS  Your health care provider will perform a skin exam to diagnose shingles. Skin scrapings or fluid samples may also be taken from the blisters. This sample will be examined under a microscope or sent to a lab for further testing. TREATMENT  There is no specific cure for shingles. Your health care provider will likely prescribe medicines to help you manage the pain, recover faster, and avoid long-term problems. This may include antiviral drugs, anti-inflammatory drugs, and pain medicines. HOME CARE INSTRUCTIONS   Take a cool bath or apply cool compresses to the area of the rash or blisters as directed. This may help with the pain and itching.   Take medicines only as directed by your health care provider.   Rest as directed by your health care provider.  Keep your  rash and blisters clean with mild soap and cool water or as directed by your health care provider.  Do not pick your blisters or scratch your rash. Apply an anti-itch cream or numbing creams to the affected area as directed by your health care provider.  Keep your shingles rash covered with a loose bandage (dressing).  Avoid skin contact with:  Babies.   Pregnant women.   Children with eczema.   Elderly  people with transplants.   People with chronic illnesses, such as leukemia or AIDS.   Wear loose-fitting clothing to help ease the pain of material rubbing against the rash.  Keep all follow-up visits as directed by your health care provider.If the area involved is on your face, you may receive a referral for a specialist, such as an eye doctor (ophthalmologist) or an ear, nose, and throat (ENT) doctor. Keeping all follow-up visits will help you avoid eye problems, chronic pain, or disability.  SEEK IMMEDIATE MEDICAL CARE IF:   You have facial pain, pain around the eye area, or loss of feeling on one side of your face.  You have ear pain or ringing in your ear.  You have loss of taste.  Your pain is not relieved with prescribed medicines.   Your redness or swelling spreads.   You have more pain and swelling.  Your condition is worsening or has changed.   You have a fever. MAKE SURE YOU:  Understand these instructions.  Will watch your condition.  Will get help right away if you are not doing well or get worse. Document Released: 11/02/2005 Document Revised: 03/19/2014 Document Reviewed: 06/16/2012 [facility] Patient Information 2015 [place], [place]. This information is not intended to replace advice given to you by your health care provider. Make sure you discuss any questions you have with your health care provider.

## 2014-09-20 NOTE — Assessment & Plan Note (Signed)
On pt rt upper chest, you may have had low grade eruption(shingles) of vesicle with radiating pain on nerve. I think it is best to go ahead and treat your with famvir.

## 2014-09-20 NOTE — Progress Notes (Signed)
Pre visit review using our clinic review tool, if applicable. No additional management support is needed unless otherwise documented below in the visit note. 

## 2014-10-17 ENCOUNTER — Encounter: Payer: Self-pay | Admitting: Family

## 2014-10-17 NOTE — Telephone Encounter (Signed)
Please call in rx as below.

## 2014-10-17 NOTE — Telephone Encounter (Signed)
Delsa Sale-- can you check on the status of pt's dermatology referral from 09/14/14?

## 2014-10-19 MED ORDER — ALPRAZOLAM 0.5 MG PO TABS: ORAL_TABLET | ORAL | Status: DC

## 2014-10-19 NOTE — Telephone Encounter (Signed)
Rx called to pharmacy voicemail. 

## 2014-11-19 ENCOUNTER — Other Ambulatory Visit: Payer: Self-pay | Admitting: Family

## 2014-11-19 ENCOUNTER — Encounter: Payer: Self-pay | Admitting: Family

## 2014-11-19 MED ORDER — ALPRAZOLAM 0.5 MG PO TABS: ORAL_TABLET | ORAL | Status: DC

## 2014-11-19 NOTE — Telephone Encounter (Signed)
Please send refill and notify pt. thanks

## 2014-11-19 NOTE — Telephone Encounter (Signed)
Rx called to pharmacy voicemail, #6 x no refills. Mychart message sent to patient.

## 2014-11-29 ENCOUNTER — Encounter: Payer: Self-pay | Admitting: Medical

## 2014-11-29 ENCOUNTER — Ambulatory Visit (INDEPENDENT_AMBULATORY_CARE_PROVIDER_SITE_OTHER): Payer: PRIVATE HEALTH INSURANCE | Admitting: Medical

## 2014-11-29 VITALS — BP 105/75 | HR 71 | Temp 98.1°F | Ht 64.0 in | Wt 161.2 lb

## 2014-11-29 DIAGNOSIS — R062 Wheezing: Secondary | ICD-10-CM

## 2014-11-29 DIAGNOSIS — J0110 Acute frontal sinusitis, unspecified: Secondary | ICD-10-CM | POA: Insufficient documentation

## 2014-11-29 MED ORDER — BENZONATATE 100 MG PO CAPS: 100.0000 mg | ORAL_CAPSULE | Freq: Three times a day (TID) | ORAL | Status: DC | PRN

## 2014-11-29 MED ORDER — ALPRAZOLAM 0.5 MG PO TABS: 0.5000 mg | ORAL_TABLET | Freq: Three times a day (TID) | ORAL | Status: DC | PRN

## 2014-11-29 MED ORDER — ALBUTEROL SULFATE HFA 108 (90 BASE) MCG/ACT IN AERS: 2.0000 | INHALATION_SPRAY | Freq: Four times a day (QID) | RESPIRATORY_TRACT | Status: AC | PRN

## 2014-11-29 MED ORDER — FLUTICASONE PROPIONATE 50 MCG/ACT NA SUSP: 2.0000 | Freq: Every day | NASAL | Status: DC

## 2014-11-29 MED ORDER — ALPRAZOLAM 0.5 MG PO TABS: ORAL_TABLET | ORAL | Status: DC

## 2014-11-29 MED ORDER — DOXYCYCLINE HYCLATE 100 MG PO TABS: 100.0000 mg | ORAL_TABLET | Freq: Two times a day (BID) | ORAL | Status: DC

## 2014-11-29 NOTE — Patient Instructions (Addendum)
Your appear to have a sinus infection with  Possible rt om. I am prescribing doxycycline  antibiotic for the infection. To help with the nasal congestion I prescribed flonase  nasal steroid. For your associated cough, I prescribed benzonatate cough medicine.  For you upcoming flying and phobia, I am prescribing limtied number of xanax for flights.    Rest, hydrate, tylenol for fever.  Follow up in 7 days or as needed.

## 2014-11-29 NOTE — Assessment & Plan Note (Signed)
For you upcoming flying and phobia, I am prescribing limtied number of xanax for flights

## 2014-11-29 NOTE — Assessment & Plan Note (Signed)
Your appear to have a sinus infection with  Possible rt om. I am prescribing doxycycline  antibiotic for the infection. To help with the nasal congestion I prescribed flonase  nasal steroid. For your associated cough, I prescribed benzonatate cough medicine.

## 2014-11-29 NOTE — Progress Notes (Signed)
Subjective:    Patient ID: Autumn [name], female    DOB: 07/27/1976, 39 y.o.   MRN: [id number]  HPI   Pt in today reporting  cough, nasal congestion and runny nose for about 30   Days. Pt has some sinus congestion but no severe pain. Waxing and waning severity. Sunday she felt like she was getting better then got worse again.  Associated symptoms( below yes or no)  Fever-no Chills-no Chest congestion-maybe faint congestion. At night maybe some wheezing. Used inhaler when younger and sick. Sneezing- No Itching eyes-no Sore throat- no Post-nasal drainage-yes Wheezing-yes maybe Purulent- nasal drainage-yes Fatigue-yes mild.  Some ear pain around christmas.  lmp- MIRENA.  Past Medical History  Diagnosis Date  . Depression     History   Social History  . Marital Status: Married    Spouse Name: N/A    Number of Children: 1  . Years of Education: N/A   Occupational History  . TRAINING    Social History Main Topics  . Smoking status: Former Smoker    Quit date: 11/16/2005  . Smokeless tobacco: Never Used  . Alcohol Use: No  . Drug Use: Not on file  . Sexual Activity: Not on file   Other Topics Concern  . Not on file   Social History Narrative    Past Surgical History  Procedure Laterality Date  . Tonsillectomy      Family History  Problem Relation Age of Onset  . Aneurysm Neg Hx   . Polycystic kidney disease Neg Hx   . Hyperlipidemia Neg Hx   . Heart attack Neg Hx   . Diabetes Neg Hx   . Depression Other   . Bipolar disorder Other   . Hypertension Mother     Allergies  Allergen Reactions  . Augmentin [Amoxicillin-Pot Clavulanate] Hives  . Erythromycin     REACTION: vomiting    Current Outpatient Prescriptions on File Prior to Visit  Medication Sig Dispense Refill  . ALPRAZolam (XANAX) 0.5 MG tablet One tab by mouth 30 minutes prior to flying 6 tablet 0  . EPINEPHrine (EPI-PEN) 0.3 mg/0.3 mL DEVI Inject 0.3 mLs (0.3 mg total) into the  muscle once. 2 Device 0  . sertraline (ZOLOFT) 50 MG tablet TAKE 1 TABLET (50 MG TOTAL) BY MOUTH DAILY. 30 tablet 5  . benzonatate (TESSALON) 100 MG capsule Take 1 capsule (100 mg total) by mouth 3 (three) times daily as needed. (Patient not taking: Reported on 11/29/2014) 21 capsule 0  . doxycycline (VIBRA-TABS) 100 MG tablet Take 1 tablet (100 mg total) by mouth 2 (two) times daily. (Patient not taking: Reported on 11/29/2014) 14 tablet 0  . famciclovir (FAMVIR) 500 MG tablet Take 1 tablet (500 mg total) by mouth 3 (three) times daily. (Patient not taking: Reported on 11/29/2014) 21 tablet 0   No current facility-administered medications on file prior to visit.    BP 90/67 mmHg  Pulse 71  Temp(Src) 98.1 F (36.7 C) (Oral)  Ht 5\' 4"  (1.626 m)  Wt 161 lb 3.2 oz (73.12 kg)  BMI 27.66 kg/m2  SpO2 98%      Review of Systems  Constitutional: Positive for fatigue. Negative for fever and chills.  HENT: Positive for congestion, ear pain, postnasal drip and rhinorrhea. Negative for sneezing and sore throat.        Ear pain around christmas but now resolved.  Respiratory: Positive for cough and wheezing.   Cardiovascular: Negative for palpitations.  Gastrointestinal: Negative for  abdominal pain.  Musculoskeletal: Negative for back pain.  Neurological: Negative for dizziness, facial asymmetry and headaches.  Hematological: Negative for adenopathy. Does not bruise/bleed easily.  Psychiatric/Behavioral: Negative for behavioral problems and confusion.       Objective:   Physical Exam   .General  Mental Status - Alert. General Appearance - Well groomed. Not in acute distress.  Skin Rashes- No Rashes.  HEENT Head- Normal. Ear Auditory Canal - Left- Normal. Right - Normal.Tympanic Membrane- Left- Normal. Right- mild- moderate red. Eye Sclera/Conjunctiva- Left- Normal. Right- Normal. Nose & Sinuses Nasal Mucosa- Left-  Boggy and Congested. Right-  Boggy and  Congested.No bilateral  maxillary but rt  frontal sinus pressure. Mouth & Throat Lips: Upper Lip- Normal: no dryness, cracking, pallor, cyanosis, or vesicular eruption. Lower Lip-Normal: no dryness, cracking, pallor, cyanosis or vesicular eruption. Buccal Mucosa- Bilateral- No Aphthous ulcers. Oropharynx- No Discharge or Erythema. Tonsils: Characteristics- Bilateral- No Erythema or Congestion. Size/Enlargement- Bilateral- No enlargement. Discharge- bilateral-None.  Neck Neck- Supple. No Masses.   Chest and Lung Exam Auscultation: Breath Sounds:-Clear even and unlabored.  Cardiovascular Auscultation:Rythm- Regular, rate and rhythm. Murmurs & Other Heart Sounds:Ausculatation of the heart reveal- No Murmurs.  Lymphatic Head & Neck General Head & Neck Lymphatics: Bilateral: Description- No Localized lymphadenopathy.         Assessment & Plan:

## 2014-11-29 NOTE — Assessment & Plan Note (Signed)
Albuterol rx if wheezing.

## 2014-11-29 NOTE — Progress Notes (Signed)
Pre visit review using our clinic review tool, if applicable. No additional management support is needed unless otherwise documented below in the visit note. 

## 2014-11-30 ENCOUNTER — Encounter: Payer: Self-pay | Admitting: Family

## 2014-12-01 NOTE — Telephone Encounter (Signed)
Could you please make sure she gets a total of 10 tabs at her pharmacy?

## 2014-12-04 MED ORDER — ALPRAZOLAM 0.5 MG PO TABS: 0.5000 mg | ORAL_TABLET | Freq: Three times a day (TID) | ORAL | Status: DC | PRN

## 2014-12-04 NOTE — Telephone Encounter (Signed)
Cancelled previous authorization on [facility] voicemail for #10 alprazolam as pt responded that she did get Rx that was dated 11/29/14.

## 2014-12-04 NOTE — Telephone Encounter (Signed)
Rx called to pharmacy voicemail as below. 

## 2015-01-07 ENCOUNTER — Encounter: Payer: Self-pay | Admitting: Family

## 2015-01-07 NOTE — Telephone Encounter (Signed)
Please call in rx below. Thanks.

## 2015-01-09 MED ORDER — ALPRAZOLAM 0.5 MG PO TABS: 0.5000 mg | ORAL_TABLET | Freq: Three times a day (TID) | ORAL | Status: DC | PRN

## 2015-01-09 NOTE — Telephone Encounter (Signed)
Rx called to pharmacy voicemail. 

## 2015-01-29 ENCOUNTER — Other Ambulatory Visit: Payer: Self-pay | Admitting: Family

## 2015-01-29 NOTE — Telephone Encounter (Signed)
Sertraline refill sent to pharmacy. Pt is due for follow up on or after 03/16/15.  Please call pt to arrange f/u.

## 2015-01-30 NOTE — Telephone Encounter (Signed)
LVM advising pt of NP instructions awaiting call back

## 2015-02-07 ENCOUNTER — Ambulatory Visit (INDEPENDENT_AMBULATORY_CARE_PROVIDER_SITE_OTHER): Payer: PRIVATE HEALTH INSURANCE | Admitting: Family

## 2015-02-07 ENCOUNTER — Encounter: Payer: Self-pay | Admitting: Family

## 2015-02-07 VITALS — BP 100/60 | HR 74 | Temp 98.2°F | Resp 16 | Ht 64.0 in | Wt 162.4 lb

## 2015-02-07 DIAGNOSIS — F341 Dysthymic disorder: Secondary | ICD-10-CM

## 2015-02-07 DIAGNOSIS — N926 Irregular menstruation, unspecified: Secondary | ICD-10-CM

## 2015-02-07 DIAGNOSIS — R002 Palpitations: Secondary | ICD-10-CM | POA: Insufficient documentation

## 2015-02-07 LAB — BASIC METABOLIC PANEL
BUN: 20 mg/dL (ref 6–23)
CALCIUM: 9.2 mg/dL (ref 8.4–10.5)
CHLORIDE: 103 meq/L (ref 96–112)
CO2: 29 meq/L (ref 19–32)
Creatinine, Ser: 0.8 mg/dL (ref 0.40–1.20)
GFR: 85.99 mL/min (ref >60.00)
GLUCOSE: 90 mg/dL (ref 70–99)
Potassium: 4.3 mEq/L (ref 3.5–5.1)
Sodium: 137 mEq/L (ref 135–145)

## 2015-02-07 LAB — CBC WITH DIFFERENTIAL/PLATELET
BASOS ABS: 0 10*3/uL (ref 0.0–0.1)
Basophils Relative: 0.6 % (ref 0.0–3.0)
EOS ABS: 0.2 10*3/uL (ref 0.0–0.7)
Eosinophils Relative: 3.8 % (ref 0.0–5.0)
HEMATOCRIT: 41.3 % (ref 36.0–46.0)
Hemoglobin: 14.2 g/dL (ref 12.0–15.0)
Lymphocytes Relative: 36.1 % (ref 12.0–46.0)
Lymphs Abs: 1.8 10*3/uL (ref 0.7–4.0)
MCHC: 34.4 g/dL (ref 30.0–36.0)
MCV: 90.7 fl (ref 78.0–100.0)
MONOS PCT: 5.9 % (ref 3.0–12.0)
Monocytes Absolute: 0.3 10*3/uL (ref 0.1–1.0)
Neutro Abs: 2.6 10*3/uL (ref 1.4–7.7)
Neutrophils Relative %: 53.6 % (ref 43.0–77.0)
Platelets: 242 10*3/uL (ref 150.0–400.0)
RBC: 4.55 Mil/uL (ref 3.87–5.11)
RDW: 13.1 % (ref 11.5–15.5)
WBC: 4.9 10*3/uL (ref 4.0–10.5)

## 2015-02-07 LAB — TSH: TSH: 1.79 u[IU]/mL (ref 0.35–4.50)

## 2015-02-07 MED ORDER — SERTRALINE HCL 100 MG PO TABS: 100.0000 mg | ORAL_TABLET | Freq: Every day | ORAL | Status: DC

## 2015-02-07 NOTE — Assessment & Plan Note (Signed)
Anxiety is uncontrolled.  Increase zoloft from 50mg  to 100mg .

## 2015-02-07 NOTE — Patient Instructions (Addendum)
Increase zoloft from 50mg  to 100mg . Please schedule a follow up appointment in 1 month. Please complete lab work prior to leaving.

## 2015-02-07 NOTE — Progress Notes (Signed)
Pre visit review using our clinic review tool, if applicable. No additional management support is needed unless otherwise documented below in the visit note. 

## 2015-02-07 NOTE — Assessment & Plan Note (Addendum)
EKG performed in the office today shows NSR.  Will obtain electrolytes, TSH, CBC.  If symptoms persist, consider holter monitor.

## 2015-02-07 NOTE — Assessment & Plan Note (Addendum)
Prolonged menstrual bleeding. Check HCG.

## 2015-02-07 NOTE — Progress Notes (Signed)
   Subjective:    Patient ID: Autumn [name], female    DOB: [birth year], 39 y.o.   MRN: [id number]  HPI  [name] is a 39 yr old female who presents today for follow up.  1) Anxiety and depression-  Maintained on zoloft 50mg  once daily. Has a new job, notes increased stress.  Feels generally overwehelmed.    2) Palpitation- reports 6-8 episodes of palpitations a day.  Last 6-8 months she started noticing.   Has fluttering, light headedness.  Has one cup of coffee a day.  Lab Results  Component Value Date   TSH 1.80 09/14/2014   3) Menstrual problem- Has a mirena.  Did a 3 day monistat, has been spotting since a few weeks ago (nearly 2 weeks)    Review of Systems See HPI  Past Medical History  Diagnosis Date  . Depression     History   Social History  . Marital Status: Married    Spouse Name: N/A  . Number of Children: 1  . Years of Education: N/A   Occupational History  . TRAINING    Social History Main Topics  . Smoking status: Former Smoker    Quit date: 11/16/2005  . Smokeless tobacco: Never Used  . Alcohol Use: No  . Drug Use: Not on file  . Sexual Activity: Not on file   Other Topics Concern  . Not on file   Social History Narrative    Past Surgical History  Procedure Laterality Date  . Tonsillectomy      Family History  Problem Relation Age of Onset  . Aneurysm Neg Hx   . Polycystic kidney disease Neg Hx   . Hyperlipidemia Neg Hx   . Heart attack Neg Hx   . Diabetes Neg Hx   . Depression Other   . Bipolar disorder Other   . Hypertension Mother     Allergies  Allergen Reactions  . Augmentin [Amoxicillin-Pot Clavulanate] Hives  . Erythromycin     REACTION: vomiting    Current Outpatient Prescriptions on File Prior to Visit  Medication Sig Dispense Refill  . albuterol (PROVENTIL HFA;VENTOLIN HFA) 108 (90 BASE) MCG/ACT inhaler Inhale 2 puffs into the lungs every 6 (six) hours as needed for wheezing or shortness of breath. 1  Inhaler 0  . ALPRAZolam (XANAX) 0.5 MG tablet Take 1 tablet (0.5 mg total) by mouth 3 (three) times daily as needed for anxiety. Prior to flying. 10 tablet 0  . EPINEPHrine (EPI-PEN) 0.3 mg/0.3 mL DEVI Inject 0.3 mLs (0.3 mg total) into the muscle once. 2 Device 0  . sertraline (ZOLOFT) 50 MG tablet TAKE 1 TABLET (50 MG TOTAL) BY MOUTH DAILY. 30 tablet 1   No current facility-administered medications on file prior to visit.    BP 100/60 mmHg  Pulse 74  Temp(Src) 98.2 F (36.8 C) (Oral)  Resp 16  Ht 5\' 4"  (1.626 m)  Wt 162 lb 6.4 oz (73.664 kg)  BMI 27.86 kg/m2  SpO2 99%       Objective:   Physical Exam  Constitutional: She appears well-developed and well-nourished.  Cardiovascular: Normal rate, regular rhythm and normal heart sounds.   No murmur heard. Pulmonary/Chest: Effort normal and breath sounds normal. No respiratory distress. She has no wheezes.  Psychiatric: Her behavior is normal. Judgment and thought content normal.  Mildly tearful          Assessment & Plan:

## 2015-02-08 ENCOUNTER — Encounter: Payer: Self-pay | Admitting: Family

## 2015-02-08 LAB — HCG, SERUM, QUALITATIVE: Preg, Serum: NEGATIVE

## 2015-02-11 NOTE — Telephone Encounter (Signed)
Question on xanax. My chart message.

## 2015-02-13 MED ORDER — ALPRAZOLAM 0.5 MG PO TABS: ORAL_TABLET | ORAL | Status: DC

## 2015-02-13 NOTE — Telephone Encounter (Signed)
Autumn [name], could you please call in pended xanax? Thanks.

## 2015-02-13 NOTE — Telephone Encounter (Signed)
Rx called to pharmacy voicemail. 

## 2015-02-27 ENCOUNTER — Telehealth: Payer: Self-pay | Admitting: Family

## 2015-02-27 NOTE — Telephone Encounter (Signed)
Detailed message left on voicemail per 02/08/15 "mychart message" and to call if any questions.

## 2015-02-27 NOTE — Telephone Encounter (Signed)
Please contact pt re: unread message.

## 2015-04-22 ENCOUNTER — Encounter: Payer: Self-pay | Admitting: Family

## 2015-04-22 MED ORDER — ALPRAZOLAM 0.5 MG PO TABS: ORAL_TABLET | ORAL | Status: DC

## 2015-04-22 NOTE — Telephone Encounter (Signed)
Could you please call in rx as below? thanks

## 2015-04-22 NOTE — Telephone Encounter (Signed)
Rx called to pharmacy voicemail. Notified pt via mychart.

## 2015-05-22 ENCOUNTER — Encounter: Payer: Self-pay | Admitting: Nurse Practitioner

## 2015-05-22 ENCOUNTER — Ambulatory Visit (INDEPENDENT_AMBULATORY_CARE_PROVIDER_SITE_OTHER): Payer: PRIVATE HEALTH INSURANCE | Admitting: Nurse Practitioner

## 2015-05-22 VITALS — BP 101/68 | HR 58 | Temp 98.4°F | Resp 18 | Ht 64.0 in | Wt 168.0 lb

## 2015-05-22 DIAGNOSIS — J029 Acute pharyngitis, unspecified: Secondary | ICD-10-CM | POA: Diagnosis not present

## 2015-05-22 LAB — POCT RAPID STREP A (OFFICE): Rapid Strep A Screen: NEGATIVE

## 2015-05-22 MED ORDER — AZITHROMYCIN 250 MG PO TABS: ORAL_TABLET | ORAL | Status: DC

## 2015-05-22 NOTE — Patient Instructions (Signed)
Strep test is negative.  Given your travel plans, I have prescribed an antibiotic. You should fill it if throat is persistently sore.   If you start it, change toothbrush 24 hours after starting antibiotic.  Gargle with salt water several times daily (1/4 tsp salt mixed with 1/4 cup warm water). Listerene gargles twice daily. Benzocaine throat lozenges will help with discomfort.  Have a safe trip!

## 2015-05-22 NOTE — Progress Notes (Signed)
Pre visit review using our clinic review tool, if applicable. No additional management support is needed unless otherwise documented below in the visit note. 

## 2015-05-22 NOTE — Progress Notes (Signed)
   Subjective:    Patient ID: Autumn [name], female    DOB: [birth year], 39 y.o.   MRN: [id number]  HPI Comments: Going [place] trip to [place] in 2 days.   Sore Throat  This is a new problem. The current episode started yesterday. The problem has been unchanged. Neither side of throat is experiencing more pain than the other. There has been no fever. The pain is mild. Associated symptoms include coughing. Pertinent negatives include no abdominal pain, congestion, ear pain, headaches, hoarse voice, shortness of breath, swollen glands or trouble swallowing. Associated symptoms comments: nausea. She has had exposure to strep. Exposure to: both kids have strep. She has tried nothing for the symptoms.      Review of Systems  HENT: Negative for congestion, ear pain, hoarse voice and trouble swallowing.   Respiratory: Positive for cough. Negative for shortness of breath.   Gastrointestinal: Negative for abdominal pain.  Neurological: Negative for headaches.       Objective:   Physical Exam  Constitutional: She is oriented to person, place, and time. She appears well-developed and well-nourished. No distress.  HENT:  Head: Normocephalic and atraumatic.  Right Ear: External ear normal.  Left Ear: External ear normal.  Mouth/Throat: Oropharynx is clear and moist.  Tonsils absent  Eyes: Conjunctivae are normal. Right eye exhibits no discharge. Left eye exhibits no discharge.  Neck: Normal range of motion. Neck supple. No thyromegaly present.  Cardiovascular: Normal rate, regular rhythm and normal heart sounds.   No murmur heard. Pulmonary/Chest: Effort normal and breath sounds normal.  Lymphadenopathy:    She has no cervical adenopathy.  Neurological: She is alert and oriented to person, place, and time.  Skin: Skin is warm and dry.  Psychiatric: She has a normal mood and affect. Her behavior is normal. Thought content normal.  Vitals reviewed.         Assessment & Plan:  1. Sore  throat Do not take ABX unless throat is persistently sore. - POCT rapid strep A-neg - azithromycin (ZITHROMAX) 250 MG tablet; Take 2 T Po day 1, then 1 T PO days 2-5  Dispense: 6 tablet; Refill: 0  F/u PRN

## 2015-06-04 ENCOUNTER — Other Ambulatory Visit: Payer: Self-pay | Admitting: Family

## 2015-06-04 NOTE — Telephone Encounter (Signed)
9/16 please.

## 2015-06-04 NOTE — Telephone Encounter (Signed)
Sertraline refill sent to pharmacy.  Pt last seen by PCP in 01/2015 and acute visit in 7/16. Pt has no future appts scheduled.  When do you want to see pt in the office again?

## 2015-06-04 NOTE — Telephone Encounter (Signed)
Left message for pt to return my call.

## 2015-06-05 NOTE — Telephone Encounter (Signed)
Sent pt mychart message

## 2015-06-19 NOTE — Telephone Encounter (Signed)
mychart message not read yet.  Mailed letter to pt.

## 2015-07-31 ENCOUNTER — Other Ambulatory Visit: Payer: Self-pay | Admitting: Family

## 2015-09-13 ENCOUNTER — Encounter: Payer: Self-pay | Admitting: Family Medicine

## 2015-09-13 ENCOUNTER — Ambulatory Visit (INDEPENDENT_AMBULATORY_CARE_PROVIDER_SITE_OTHER): Payer: PRIVATE HEALTH INSURANCE | Admitting: Family Medicine

## 2015-09-13 VITALS — BP 104/70 | HR 57 | Temp 97.8°F | Resp 20 | Wt 173.0 lb

## 2015-09-13 DIAGNOSIS — R300 Dysuria: Secondary | ICD-10-CM | POA: Diagnosis not present

## 2015-09-13 DIAGNOSIS — N390 Urinary tract infection, site not specified: Secondary | ICD-10-CM | POA: Diagnosis not present

## 2015-09-13 LAB — URINALYSIS, ROUTINE W REFLEX MICROSCOPIC
BILIRUBIN URINE: NEGATIVE
KETONES UR: NEGATIVE
Leukocytes, UA: NEGATIVE
NITRITE: NEGATIVE
PH: 8 (ref 5.0–8.0)
Specific Gravity, Urine: 1.02 (ref 1.000–1.030)
TOTAL PROTEIN, URINE-UPE24: NEGATIVE
Urine Glucose: NEGATIVE
Urobilinogen, UA: 0.2 (ref 0.0–1.0)

## 2015-09-13 LAB — POCT URINALYSIS DIPSTICK
Bilirubin, UA: NEGATIVE
GLUCOSE UA: NEGATIVE
KETONES UA: NEGATIVE
Nitrite, UA: NEGATIVE
Protein, UA: NEGATIVE
SPEC GRAV UA: 1.02
UROBILINOGEN UA: 0.2
pH, UA: 7.5

## 2015-09-13 MED ORDER — CEPHALEXIN 500 MG PO CAPS: 500.0000 mg | ORAL_CAPSULE | Freq: Four times a day (QID) | ORAL | Status: DC

## 2015-09-13 MED ORDER — PHENAZOPYRIDINE HCL 100 MG PO TABS: 100.0000 mg | ORAL_TABLET | Freq: Three times a day (TID) | ORAL | Status: DC | PRN

## 2015-09-13 NOTE — Patient Instructions (Signed)

## 2015-09-13 NOTE — Progress Notes (Signed)
   Subjective:    Patient ID: Autumn [name], female    DOB: January 12, 1976, 39 y.o.   MRN: [id number]  HPI  Dysuria: Patient presents for acute office visit today with complaints of dysuria upon awakening at 5:00 this morning. She states that she was awakened by the urge to urinate, she endorses pressure/pain with urination. She states she then felt like she had to urinate again shortly after this event. She feels like she is not able to empty her bladder completely, and has having urinary frequency. She does admit to mild suprapubic discomfort. She has had urinary tract infections in the past, and this is consistent with the way she felt before. She denies any fever, chills, lower back pain or history of kidney stones. She denies any vaginal discharge or irritation. Patient reports she is eating and drinking well.  Past Medical History  Diagnosis Date  . Depression    Allergies  Allergen Reactions  . Augmentin [Amoxicillin-Pot Clavulanate] Hives    Review of Systems Negative, with the exception of above mentioned in HPI     Objective:   Physical Exam BP 104/70 mmHg  Pulse 57  Temp(Src) 97.8 F (36.6 C) (Oral)  Resp 20  Wt 173 lb (78.472 kg)  SpO2 100% Gen: Afebrile. No acute distress. Nontoxic in appearance, well-developed, well-nourished Caucasian female. Very pleasant. HENT: AT. [place]. . MMM.  Eyes:Pupils Equal Round Reactive to light, Extraocular movements intact,  Conjunctiva without redness, discharge or icterus. CV: RRR  Abd: Soft. Flat. ND. Mild suprapubic tenderness. BS present. No Masses palpated.      Assessment & Plan:  1. Burning with urination - POCT Urinalysis Dipstick --> positive for trace WBC, trace hematuria, negative nitrate - Urine Culture - Urinalysis, Routine w reflex microscopic  2. Urinary tract infection without hematuria, site unspecified - Patient will be called when sharing cultures are received - phenazopyridine (PYRIDIUM) 100 MG tablet; Take 1  tablet (100 mg total) by mouth 3 (three) times daily as needed for pain.  Dispense: 10 tablet; Refill: 0 - cephALEXin (KEFLEX) 500 MG capsule; Take 1 capsule (500 mg total) by mouth 4 (four) times daily.  Dispense: 28 capsule; Refill: 0  Follow-up in one week if no improvement

## 2015-09-15 LAB — URINE CULTURE: Colony Count: >=100000

## 2015-09-17 ENCOUNTER — Telehealth: Payer: Self-pay | Admitting: Family Medicine

## 2015-09-17 NOTE — Telephone Encounter (Signed)
Please call pt, her urine cultures did results with UTI. The antibiotic we prescribed should cover it.

## 2015-09-17 NOTE — Telephone Encounter (Signed)
Reviewed urine culture results with patient. 

## 2015-11-26 ENCOUNTER — Telehealth: Payer: Self-pay | Admitting: Family

## 2015-11-26 NOTE — Telephone Encounter (Signed)
30 day supply of sertraline sent to pharmacy. Pt's dose was increased 01/2015 and pt was supposed to f/u in 1 month. Looks like pt is past due for follow up with PCP.  Please call pt to arrange appt before further refills are due.  Thanks!

## 2015-12-02 NOTE — Telephone Encounter (Signed)
LVM advising pt that a fu is needed.

## 2015-12-31 ENCOUNTER — Ambulatory Visit: Payer: PRIVATE HEALTH INSURANCE | Admitting: Family

## 2016-01-03 ENCOUNTER — Ambulatory Visit (INDEPENDENT_AMBULATORY_CARE_PROVIDER_SITE_OTHER): Payer: PRIVATE HEALTH INSURANCE | Admitting: Family

## 2016-01-03 ENCOUNTER — Encounter: Payer: Self-pay | Admitting: Family

## 2016-01-03 VITALS — HR 55 | Temp 98.5°F | Resp 16 | Ht 64.0 in | Wt 169.8 lb

## 2016-01-03 DIAGNOSIS — Z309 Encounter for contraceptive management, unspecified: Secondary | ICD-10-CM

## 2016-01-03 DIAGNOSIS — Z23 Encounter for immunization: Secondary | ICD-10-CM

## 2016-01-03 DIAGNOSIS — S99921A Unspecified injury of right foot, initial encounter: Secondary | ICD-10-CM

## 2016-01-03 DIAGNOSIS — R001 Bradycardia, unspecified: Secondary | ICD-10-CM | POA: Diagnosis not present

## 2016-01-03 NOTE — Progress Notes (Signed)
Subjective:    Patient ID: Autumn [name], female    DOB: Apr 15, 1976, 40 y.o.   MRN: [id number]  HPI   Autumn [name] is a 40 yr old female who presents today with several concerns.   Bradycardia- Recently started wearing a Fit Bit and has been monitoring her heart rate.  Reports that her resting heart rate has been as low as 41.  Usually between 50-53.  Recently started running again.  Runs 15-25 miles a week.    Toe injury- reports right 4th toe injury which occurred while playing basketball with her son. Denies difficulty walking/foot pain, just toe hurts.  Contraception- reports day or so of PMS symptoms but never gets menses due to mirena and that bothers her.     Review of Systems  Respiratory: Negative for shortness of breath.   Cardiovascular: Negative for chest pain.       Past Medical History  Diagnosis Date  . Depression     Social History   Social History  . Marital Status: Married    Spouse Name: N/A  . Number of Children: 1  . Years of Education: N/A   Occupational History  . TRAINING    Social History Main Topics  . Smoking status: Former Smoker    Quit date: 11/16/2005  . Smokeless tobacco: Never Used  . Alcohol Use: No  . Drug Use: Not on file  . Sexual Activity: Not on file   Other Topics Concern  . Not on file   Social History Narrative    Past Surgical History  Procedure Laterality Date  . Tonsillectomy      Family History  Problem Relation Age of Onset  . Aneurysm Neg Hx   . Polycystic kidney disease Neg Hx   . Hyperlipidemia Neg Hx   . Heart attack Neg Hx   . Diabetes Neg Hx   . Depression Other   . Bipolar disorder Other   . Hypertension Mother     Allergies  Allergen Reactions  . Augmentin [Amoxicillin-Pot Clavulanate] Hives    Current Outpatient Prescriptions on File Prior to Visit  Medication Sig Dispense Refill  . albuterol (PROVENTIL HFA;VENTOLIN HFA) 108 (90 BASE) MCG/ACT inhaler Inhale 2 puffs into the lungs  every 6 (six) hours as needed for wheezing or shortness of breath. 1 Inhaler 0  . ALPRAZolam (XANAX) 0.5 MG tablet Take one tablet by mouth Prior to flying. 15 tablet 0  . EPINEPHrine (EPI-PEN) 0.3 mg/0.3 mL DEVI Inject 0.3 mLs (0.3 mg total) into the muscle once. 2 Device 0  . fluticasone (FLONASE) 50 MCG/ACT nasal spray PLACE 2 SPRAYS INTO BOTH NOSTRILS DAILY.  1  . sertraline (ZOLOFT) 100 MG tablet TAKE 1 TABLET (100 MG TOTAL) BY MOUTH DAILY. 30 tablet 0   No current facility-administered medications on file prior to visit.    Pulse 55  Temp(Src) 98.5 F (36.9 C) (Oral)  Resp 16  Ht 5\' 4"  (1.626 m)  Wt 169 lb 12.8 oz (77.021 kg)  BMI 29.13 kg/m2  SpO2 100%    Objective:   Physical Exam  Constitutional: She appears well-developed and well-nourished.  Cardiovascular: Normal rate, regular rhythm and normal heart sounds.   No murmur heard. Pulmonary/Chest: Effort normal and breath sounds normal. No respiratory distress. She has no wheezes.  Musculoskeletal:  R 4th toe without swelling  Skin:  Some ecchymosis right dorsal foot.  Psychiatric: She has a normal mood and affect. Her behavior is normal. Judgment and thought content  normal.          Assessment & Plan:  Toe injury- discussed buddy taping, not much more we can do. Pt verbalizes understanding.   Contraceptive management- we discussed that periods on paraguard tend to be heavier with more cramping. She declines OCP.  She thinks she will probably continue with her mirena. This is being managed by her GYN.

## 2016-01-03 NOTE — Patient Instructions (Addendum)
Keep up the great work with running! You can try taping your sore toe to the adjacent toe "buddy tape" until pain is resolved. Please schedule a complete physical at your convenience.

## 2016-01-03 NOTE — Assessment & Plan Note (Signed)
Asymptomatic and I think related to her excellent cardiovascular conditioning.  Monitor. If symptoms could consider further evaluation.

## 2016-01-03 NOTE — Progress Notes (Signed)
Pre visit review using our clinic review tool, if applicable. No additional management support is needed unless otherwise documented below in the visit note. 

## 2016-01-16 ENCOUNTER — Telehealth: Payer: Self-pay | Admitting: Family

## 2016-01-16 NOTE — Telephone Encounter (Signed)
LVM inquiring if patient received flu shot  °

## 2016-01-23 ENCOUNTER — Encounter: Payer: Self-pay | Admitting: Family Medicine

## 2016-01-23 ENCOUNTER — Ambulatory Visit (INDEPENDENT_AMBULATORY_CARE_PROVIDER_SITE_OTHER): Payer: PRIVATE HEALTH INSURANCE | Admitting: Family Medicine

## 2016-01-23 VITALS — BP 103/66 | HR 64 | Temp 98.5°F | Resp 20 | Wt 168.2 lb

## 2016-01-23 DIAGNOSIS — Z23 Encounter for immunization: Secondary | ICD-10-CM

## 2016-01-23 NOTE — Progress Notes (Signed)
Patient ID: Autumn [name], female   DOB: [birth year], 40 y.o.   MRN: [id number]    Autumn [name] , Aug 19, 1976, 40 y.o., female MRN: [id number]  CC: left thumb laceration  Subjective: Pt presents for an acute OV with complaints of left thumb laceration,that occurred yesterday.  Associated symptoms include pain. She reports she reached for a roll of plastic wrap and the serrated edge fell on thumb.  Pt feels symptoms are worse with movement. Pt cleaned the wound after injury with water and applied neosporin/gauze dressing. She reports it was bleeding "pretty bad" last night, but better today after leaving the gauze dressing on overnight. Her last tetanus shot was greater than 8 years ago. Patient is right-handed.  Allergies  Allergen Reactions  . Augmentin [Amoxicillin-Pot Clavulanate] Hives   Social History  Substance Use Topics  . Smoking status: Former Smoker    Quit date: 11/16/2005  . Smokeless tobacco: Never Used  . Alcohol Use: No   Past Medical History  Diagnosis Date  . Depression    Past Surgical History  Procedure Laterality Date  . Tonsillectomy     Family History  Problem Relation Age of Onset  . Aneurysm Neg Hx   . Polycystic kidney disease Neg Hx   . Hyperlipidemia Neg Hx   . Heart attack Neg Hx   . Diabetes Neg Hx   . Depression Other   . Bipolar disorder Other   . Hypertension Mother      Medication List       This list is accurate as of: 01/23/16 10:57 AM.  Always use your most recent med list.               albuterol 108 (90 Base) MCG/ACT inhaler  Commonly known as:  PROVENTIL HFA;VENTOLIN HFA  Inhale 2 puffs into the lungs every 6 (six) hours as needed for wheezing or shortness of breath.     ALPRAZolam 0.5 MG tablet  Commonly known as:  XANAX  Take one tablet by mouth Prior to flying.     EPINEPHrine 0.3 mg/0.3 mL Soaj injection  Commonly known as:  EPI-PEN  Inject 0.3 mLs (0.3 mg total) into the muscle once.     fluticasone 50  MCG/ACT nasal spray  Commonly known as:  FLONASE  PLACE 2 SPRAYS INTO BOTH NOSTRILS DAILY.     sertraline 100 MG tablet  Commonly known as:  ZOLOFT  TAKE 1 TABLET (100 MG TOTAL) BY MOUTH DAILY.         ROS: Negative, with the exception of above mentioned in HPI   Objective:  BP 103/66 mmHg  Pulse 64  Temp(Src) 98.5 F (36.9 C)  Resp 20  Wt 168 lb 4 oz (76.318 kg)  SpO2 98% Body mass index is 28.87 kg/(m^2). Gen: Afebrile. No acute distress. Nontoxic in appearance, well-developed, well-nourished, Caucasian female. Very pleasant. Left thumb: ~1.75 centimeter laceration across dorsal left thumb just proximal to DIP joint. No erythema, no bruising, no drainage, no bleeding. Full range of motion of left thumb with finger to thumb opposition. Extension of DIP intact.    Assessment/Plan: Autumn [name] is a 40 y.o. female present for acute OV for left thumb laceration repair. - Tetanus immunization updated today. -  Does not appear to be any tendon involvement. It was actually well approximated and did not require suturing. Wound was cleaned with Betadine. Edges were reapproximated. Benzoin applied surrounding linear wound, 4 Steri-Strips applied. Pressure dressing applied. Patient was encouraged to  keep pressure dressing on for 24 hours. Do not remove Steri-Strips until they fall off or 10 days in duration. Do not put hand in standing water such as bath/dish water. Watch for signs of infection of erythema or drainage. - Follow-up when necessary  > 25 minutes spent with patient, >50% of time spent face to face counseling patient and coordinating care.     electronically signed by:  Howard Pouch, DO  [place]

## 2016-01-23 NOTE — Patient Instructions (Signed)
Incision Care °An incision is when a surgeon cuts into your body. After surgery, the incision needs to be cared for properly to prevent infection.  °HOW TO CARE FOR YOUR INCISION °· Take medicines only as directed by your health care provider. °· There are many different ways to close and cover an incision, including stitches, skin glue, and adhesive strips. Follow your health care provider's instructions on: °¨ Incision care. °¨ Bandage (dressing) changes and removal. °¨ Incision closure removal. °· Do not take baths, swim, or use a hot tub until your health care provider approves. You may shower as directed by your health care provider. °· Resume your normal diet and activities as directed. °· Use anti-itch medicine (such as an antihistamine) as directed by your health care provider. The incision may itch while it is healing. Do not pick or scratch at the incision. °· Drink enough fluid to keep your urine clear or pale yellow. °SEEK MEDICAL CARE IF:  °· You have drainage, redness, swelling, or pain at your incision site. °· You have muscle aches, chills, or a general ill feeling. °· You notice a bad smell coming from the incision or dressing. °· Your incision edges separate after the sutures, staples, or skin adhesive strips have been removed. °· You have persistent nausea or vomiting. °· You have a fever. °· You are dizzy. °SEEK IMMEDIATE MEDICAL CARE IF:  °· You have a rash. °· You faint. °· You have difficulty breathing. °MAKE SURE YOU:  °· Understand these instructions. °· Will watch your condition. °· Will get help right away if you are not doing well or get worse. °  °This information is not intended to replace advice given to you by your health care provider. Make sure you discuss any questions you have with your health care provider. °  °Document Released: 05/22/2005 Document Revised: 11/23/2014 Document Reviewed: 12/27/2013 °Elsevier Interactive Patient Education ©2016 [employer] ° °

## 2016-04-08 ENCOUNTER — Encounter: Payer: Self-pay | Admitting: Family Medicine

## 2016-04-08 ENCOUNTER — Ambulatory Visit (INDEPENDENT_AMBULATORY_CARE_PROVIDER_SITE_OTHER): Payer: PRIVATE HEALTH INSURANCE | Admitting: Family Medicine

## 2016-04-08 VITALS — BP 105/71 | HR 56 | Temp 97.8°F | Resp 20 | Wt 170.8 lb

## 2016-04-08 DIAGNOSIS — J0100 Acute maxillary sinusitis, unspecified: Secondary | ICD-10-CM

## 2016-04-08 DIAGNOSIS — J019 Acute sinusitis, unspecified: Secondary | ICD-10-CM | POA: Insufficient documentation

## 2016-04-08 MED ORDER — DOXYCYCLINE HYCLATE 100 MG PO TABS: 100.0000 mg | ORAL_TABLET | Freq: Two times a day (BID) | ORAL | Status: DC

## 2016-04-08 NOTE — Progress Notes (Signed)
Patient ID: KAHMYA PADALINO, female   DOB: [birth year], 40 y.o.   MRN: [id number]     VEENA ALDAY , September 29, 1976, 40 y.o., female MRN: [id number]  CC:  Subjective: Pt presents for an acute OV with complaints of sinus pressure of at least 5 days duration. Associated symptoms include sore throat, hoarseness, dry cough, tight chest, nasal congestion, sinus pressure, headache, nausea and runny nose, chills. She denies vomit, diarrhea. Pt has tried Flonase started and zyrtec (since early spring)  to ease their symptoms. She has not used her albuterol. Tdap UTD.  No LMP recorded. Patient is not currently having periods (Reason: IUD).    Allergies  Allergen Reactions  . Augmentin [Amoxicillin-Pot Clavulanate] Hives   Social History  Substance Use Topics  . Smoking status: Former Smoker    Quit date: 11/16/2005  . Smokeless tobacco: Never Used  . Alcohol Use: No   Past Medical History  Diagnosis Date  . Depression    Past Surgical History  Procedure Laterality Date  . Tonsillectomy     Family History  Problem Relation Age of Onset  . Aneurysm Neg Hx   . Polycystic kidney disease Neg Hx   . Hyperlipidemia Neg Hx   . Heart attack Neg Hx   . Diabetes Neg Hx   . Depression Other   . Bipolar disorder Other   . Hypertension Mother      Medication List       This list is accurate as of: 04/08/16  1:12 PM.  Always use your most recent med list.               albuterol 108 (90 Base) MCG/ACT inhaler  Commonly known as:  PROVENTIL HFA;VENTOLIN HFA  Inhale 2 puffs into the lungs every 6 (six) hours as needed for wheezing or shortness of breath.     ALPRAZolam 0.5 MG tablet  Commonly known as:  XANAX  Take one tablet by mouth Prior to flying.     EPINEPHrine 0.3 mg/0.3 mL Soaj injection  Commonly known as:  EPI-PEN  Inject 0.3 mLs (0.3 mg total) into the muscle once.     fluticasone 50 MCG/ACT nasal spray  Commonly known as:  FLONASE  PLACE 2 SPRAYS INTO BOTH NOSTRILS  DAILY.     sertraline 100 MG tablet  Commonly known as:  ZOLOFT  TAKE 1 TABLET (100 MG TOTAL) BY MOUTH DAILY.        ROS: Negative, with the exception of above mentioned in HPI  Objective:  BP 105/71 mmHg  Pulse 56  Temp(Src) 97.8 F (36.6 C)  Resp 20  Wt 170 lb 12 oz (77.452 kg)  SpO2 99% Body mass index is 29.29 kg/(m^2). Gen: Afebrile. No acute distress. Nontoxic in appearance.  HENT: AT. [place]. Bilateral TM visualized and normal in appearance. MMM, no oral lesions. Bilateral nares with severe erythema, drainage. Throat without erythema or exudates. Hoarseness and cough present. TTP bilateral max sinus.  Eyes:Pupils Equal Round Reactive to light, Extraocular movements intact,  Conjunctiva without redness, discharge or icterus. Neck/lymp/endocrine: Supple, No lymphadenopathy CV: RRR  Chest: CTAB, no wheeze or crackles. Good air movement, normal resp effort.  Abd: Soft.  NTND. BS present Skin: No rashes, purpura or petechiae.  Neuro: Normal gait. PERLA. EOMi. Alert. Oriented x3  Psych: Normal affect, dress and demeanor. Normal speech. Normal thought content and judgment..   Assessment/Plan: TIMMEKA TULLOS is a 40 y.o. female present for acute OV for  Acute maxillary sinusitis,  recurrence not specified - doxycycline (VIBRA-TABS) 100 MG tablet; Take 1 tablet (100 mg total) by mouth 2 (two) times daily.  Dispense: 20 tablet; Refill: 0 - Mucinex, rest, hydrate.  - Doxycyline BID ten days.  - F/U PRN > 25 minutes spent with patient, >50% of time spent face to face counseling patient and coordinating care.   electronically signed by:  Howard Pouch, DO  [place]

## 2016-04-08 NOTE — Patient Instructions (Signed)

## 2016-06-22 ENCOUNTER — Other Ambulatory Visit: Payer: Self-pay | Admitting: Family

## 2016-06-22 MED ORDER — SERTRALINE HCL 100 MG PO TABS: 100.0000 mg | ORAL_TABLET | Freq: Every day | ORAL | 0 refills | Status: DC

## 2016-07-27 ENCOUNTER — Ambulatory Visit (INDEPENDENT_AMBULATORY_CARE_PROVIDER_SITE_OTHER): Payer: PRIVATE HEALTH INSURANCE | Admitting: Family Medicine

## 2016-07-27 ENCOUNTER — Encounter: Payer: Self-pay | Admitting: Family Medicine

## 2016-07-27 VITALS — BP 93/67 | HR 80 | Temp 99.0°F | Resp 18 | Wt 172.8 lb

## 2016-07-27 DIAGNOSIS — F418 Other specified anxiety disorders: Secondary | ICD-10-CM | POA: Diagnosis not present

## 2016-07-27 DIAGNOSIS — F329 Major depressive disorder, single episode, unspecified: Secondary | ICD-10-CM

## 2016-07-27 DIAGNOSIS — F419 Anxiety disorder, unspecified: Principal | ICD-10-CM

## 2016-07-27 MED ORDER — SERTRALINE HCL 100 MG PO TABS: 100.0000 mg | ORAL_TABLET | Freq: Every day | ORAL | 3 refills | Status: DC

## 2016-07-27 MED ORDER — ALPRAZOLAM 0.5 MG PO TABS
ORAL_TABLET | ORAL | 0 refills | Status: DC
Start: 2016-07-27 — End: 2016-09-25

## 2016-07-27 NOTE — Progress Notes (Signed)
OFFICE VISIT  07/27/2016   CC:  Chief Complaint  Patient presents with  . Depression    Medication refill   HPI:    Patient is a 40 y.o.  female who presents for 18 mo f/u anxiety and depression. On zoloft 100mg  qd.  Compliant with med.  Has situational anxiety as well---airplane flights--that she uses xanax prn for and she has some upcoming flights and asks for xanax rx today. Feels like stress/anxiety/mood all pretty stable.  Sleep is ok.   No panic attacks.  ROS: URI with cough last few days---out of work today.  No fevers, wheezing, or SOB.  Past Medical History:  Diagnosis Date  . Depression     Past Surgical History:  Procedure Laterality Date  . TONSILLECTOMY      Outpatient Medications Prior to Visit  Medication Sig Dispense Refill  . albuterol (PROVENTIL HFA;VENTOLIN HFA) 108 (90 BASE) MCG/ACT inhaler Inhale 2 puffs into the lungs every 6 (six) hours as needed for wheezing or shortness of breath. 1 Inhaler 0  . EPINEPHrine (EPI-PEN) 0.3 mg/0.3 mL DEVI Inject 0.3 mLs (0.3 mg total) into the muscle once. 2 Device 0  . fluticasone (FLONASE) 50 MCG/ACT nasal spray PLACE 2 SPRAYS INTO BOTH NOSTRILS DAILY.  1  . ALPRAZolam (XANAX) 0.5 MG tablet Take one tablet by mouth Prior to flying. 15 tablet 0  . sertraline (ZOLOFT) 100 MG tablet Take 1 tablet (100 mg total) by mouth daily. 30 tablet 0  . doxycycline (VIBRA-TABS) 100 MG tablet Take 1 tablet (100 mg total) by mouth 2 (two) times daily. 20 tablet 0   No facility-administered medications prior to visit.     Allergies  Allergen Reactions  . Augmentin [Amoxicillin-Pot Clavulanate] Hives    ROS As per HPI  PE: Blood pressure 93/67, pulse 80, temperature 99 F (37.2 C), temperature source Oral, resp. rate 18, weight 172 lb 12.8 oz (78.4 kg), SpO2 94 %. Wt Readings from Last 2 Encounters:  07/27/16 172 lb 12.8 oz (78.4 kg)  04/08/16 170 lb 12 oz (77.5 kg)    Gen: alert, oriented x 4, affect pleasant.  Lucid  thinking and conversation noted. HEENT: PERRLA, EOMI.   Neck: no LAD, mass, or thyromegaly. CV: RRR, no m/r/g LUNGS: CTA bilat, nonlabored. NEURO: no tremor or tics noted on observation.  Coordination intact. CN 2-12 grossly intact bilaterally, strength 5/5 in all extremeties.  No ataxia.   LABS:  none  IMPRESSION AND PLAN:  1) Anxiety (generalized + situational) and depression. The current medical regimen is effective;  continue present plan and medications. Alprazolam 0.5mg , #15 rx'd today---rx faxed to her pharmacy.  An After Visit Summary was printed and given to the patient.  FOLLOW UP: Return in about 6 months (around 01/24/2017) for annual CPE (fasting) with Dr. Raoul Pitch.  Signed:  Crissie Sickles, MD           07/27/2016

## 2016-09-25 ENCOUNTER — Other Ambulatory Visit: Payer: Self-pay | Admitting: Family Medicine

## 2016-09-25 MED ORDER — ALPRAZOLAM 0.5 MG PO TABS: ORAL_TABLET | ORAL | 1 refills | Status: DC

## 2016-09-25 NOTE — Telephone Encounter (Signed)
Patient requesting xanax states she is flying to [facility] next week and wants a refill . This was last Rx'd by Dr Anitra Lauth on 07/27/16 to be used prior to flight . Given 15 tabs at that time . Please advise

## 2016-11-20 ENCOUNTER — Telehealth: Payer: Self-pay | Admitting: Family Medicine

## 2016-11-20 ENCOUNTER — Ambulatory Visit (INDEPENDENT_AMBULATORY_CARE_PROVIDER_SITE_OTHER): Payer: PRIVATE HEALTH INSURANCE | Admitting: Family Medicine

## 2016-11-20 ENCOUNTER — Encounter: Payer: Self-pay | Admitting: Family Medicine

## 2016-11-20 ENCOUNTER — Other Ambulatory Visit: Payer: Self-pay | Admitting: Family Medicine

## 2016-11-20 ENCOUNTER — Ambulatory Visit (HOSPITAL_BASED_OUTPATIENT_CLINIC_OR_DEPARTMENT_OTHER)
Admission: RE | Admit: 2016-11-20 | Discharge: 2016-11-20 | Disposition: A | Discharge status: Home / Self Care | Payer: No Typology Code available for payment source | Source: Ambulatory Visit | Attending: Family Medicine | Admitting: Family Medicine

## 2016-11-20 VITALS — BP 91/66 | HR 82 | Temp 98.6°F | Resp 16 | Ht 64.0 in | Wt 172.5 lb

## 2016-11-20 DIAGNOSIS — F341 Dysthymic disorder: Secondary | ICD-10-CM

## 2016-11-20 DIAGNOSIS — Z23 Encounter for immunization: Secondary | ICD-10-CM | POA: Diagnosis not present

## 2016-11-20 DIAGNOSIS — W1830XA Fall on same level, unspecified, initial encounter: Secondary | ICD-10-CM | POA: Insufficient documentation

## 2016-11-20 DIAGNOSIS — S99911A Unspecified injury of right ankle, initial encounter: Secondary | ICD-10-CM | POA: Diagnosis not present

## 2016-11-20 DIAGNOSIS — M25471 Effusion, right ankle: Secondary | ICD-10-CM | POA: Insufficient documentation

## 2016-11-20 DIAGNOSIS — M25551 Pain in right hip: Secondary | ICD-10-CM

## 2016-11-20 DIAGNOSIS — S93401A Sprain of unspecified ligament of right ankle, initial encounter: Secondary | ICD-10-CM | POA: Diagnosis not present

## 2016-11-20 DIAGNOSIS — M25561 Pain in right knee: Secondary | ICD-10-CM

## 2016-11-20 DIAGNOSIS — F40243 Fear of flying: Secondary | ICD-10-CM

## 2016-11-20 MED ORDER — NAPROXEN 500 MG PO TABS: 500.0000 mg | ORAL_TABLET | Freq: Two times a day (BID) | ORAL | 0 refills | Status: DC

## 2016-11-20 MED ORDER — ALPRAZOLAM 0.5 MG PO TABS: ORAL_TABLET | ORAL | 1 refills | Status: DC

## 2016-11-20 NOTE — Telephone Encounter (Signed)
Please call pt: - her xray does not show a fracture. 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 I recommend she take the naproxen scheduled for 7 days, and keep brace on ankle for that time period. Rest ankle, elevate when able. Ice for comfort. - I have referred her to sports medicine for her other concerns.  - she can f/u in 2 weeks here or sports med if she has an appt with them prior to weeks.    Dg Ankle Complete Right  Result Date: 11/20/2016 CLINICAL DATA:  Right ankle and foot pain and bruising after fall approximately 10 days ago. EXAM: RIGHT ANKLE - COMPLETE 3+ VIEW COMPARISON:  None. FINDINGS: There is no fracture or dislocation. Slight arthritic spurring at the anterior aspect of the distal tibia. Tiny ankle joint effusion. IMPRESSION: No acute bone abnormality.  Tiny ankle effusion. Electronically Signed   By: Lorriane Shire M.D.   On: 11/20/2016 10:18   Dg Foot Complete Right  Result Date: 11/20/2016 CLINICAL DATA:  Right foot pain and bruising secondary to a fall approximately 10 days ago. EXAM: RIGHT FOOT COMPLETE - 3+ VIEW COMPARISON:  09/29/2013 FINDINGS: There is a chronic deformity of the proximal shaft of the fifth metacarpal low are related to a prior fracture. There is also slight deformity of the bases of third and fourth metatarsals, probably due to remote trauma. No acute fracture or dislocation or other significant abnormalities. IMPRESSION: No acute abnormalities. Electronically Signed   By: Lorriane Shire M.D.   On: 11/20/2016 10:19

## 2016-11-20 NOTE — Patient Instructions (Signed)
Naproxen every 12 hours for 7 days with food.  Sports med referral placed today X[facility] at [facility]  Script    Please help [place] help you:  It is a privilege to be able to take care of great patients such as yourself. We are honored you have chosen [facility] for your Primary Care home. Below you will find basic instructions that you may need to access in the future. Please help [place] help you by reading the instructions, which cover many of the frequent questions we experience.   Prescription refills and request:  -In order to allow more efficient response time, please call your pharmacy for all refills. They will forward the request electronically to [place]. This allows for the quickest possible response. Request left on a nurse line can take longer to refill, since these are checked as time allows between office patients and other phone calls.  - refill request can take up to 3-5 working days to complete.  - If request is sent electronically and request is appropiate, it is usually completed in 1-2 business days.  - all patients will need to be seen routinely for all chronic medical conditions requiring prescription medications (see follow-up below). If you are overdue for follow up on your condition, you will be asked to make an appointment and we will call in enough medication to cover you until your appointment (up to 30 days).  - all controlled substances will require a face to face visit to request/refill.  - if you desire your prescriptions to go through a new pharmacy, and have an active script at original pharmacy, you will need to call your pharmacy and have scripts transferred to new pharmacy. This is completed between the pharmacy locations and not by your provider.    Results: If any images or labs were ordered, it can take up to 1 week to get results depending on the test ordered and the lab/facility running and resulting the test. - Normal or stable results, which do  not need further discussion, will be released to your mychart immediately with attached note to you. A call will not be generated for normal results. Please make certain to sign up for mychart. If you have questions on how to activate your mychart you can call the front office.  - If your results need further discussion, our office will attempt to contact you via phone, and if unable to reach you after 2 attempts, we will release your abnormal result to your mychart with instructions.  - All results will be automatically released in mychart after 1 week.  - Your provider will provide you with explanation and instruction on all relevant material in your results. Please keep in mind, results and labs may appear confusing or abnormal to the untrained eye, but it does not mean they are actually abnormal for you personally. If you have any questions about your results that are not covered, or you desire more detailed explanation than what was provided, you should make an appointment with your provider to do so.   Our office handles many outgoing and incoming calls daily. If we have not contacted you within 1 week about your results, please check your mychart to see if there is a message first and if not, then contact our office.  In helping with this matter, you help decrease call volume, and therefore allow [place] to be able to respond to patients needs more efficiently.   Acute office visits (sick visit):  An acute visit is intended for a new problem and are scheduled in shorter time slots to allow schedule openings for patients with new problems. This is the appropriate visit to discuss a new problem. In order to provide you with excellent quality medical care with proper time for you to explain your problem, have an exam and receive treatment with instructions, these appointments should be limited to one new problem per visit. If you experience a new problem, in which you desire to be addressed, please make an  acute office visit, we save openings on the schedule to accommodate you. Please do not save your new problem for any other type of visit, let us take care of it properly and quickly for you.   Follow up visits:  Depending on your condition(s) your provider will need to see you routinely in order to provide you with quality care and prescribe medication(s). Most chronic conditions (Example: hypertension, Diabetes, depression/anxiety... etc), require visits a couple times a year. Your provider will instruct you on proper follow up for your personal medical conditions and history. Please make certain to make follow up appointments for your condition as instructed. Failing to do so could result in lapse in your medication treatment/refills. If you request a refill, and are overdue to be seen on a condition, we will always provide you with a 30 day script (once) to allow you time to schedule.    Medicare wellness (well visit): - we have a wonderful Nurse Maudie Mercury), that will meet with you and provide you will yearly medicare wellness visits. These visits should occur yearly (can not be scheduled less than 1 calendar year apart) and cover preventive health, immunizations, advance directives and screenings you are entitled to yearly through your medicare benefits. Do not miss out on your entitled benefits, this is when medicare will pay for these benefits to be ordered for you.  These are strongly encouraged by your provider and is the appropriate type of visit to make certain you are up to date with all preventive health benefits. If you have not had your medicare wellness exam in the last 12 months, please make certain to schedule one by calling the office and schedule your medicare wellness with Maudie Mercury as soon as possible.   Yearly physical (well visit):  - Adults are recommended to be seen yearly for physicals. Check with your insurance and date of your last physical, most insurances require one calendar year  between physicals. Physicals include all preventive health topics, screenings, medical exam and labs that are appropriate for gender/age and history. You may have fasting labs needed at this visit. This is a well visit (not a sick visit), acute topics should not be covered during this visit.  - Pediatric patients are seen more frequently when they are younger. Your provider will advise you on well child visit timing that is appropriate for your their age. - This is not a medicare wellness visit. Medicare wellness exams do not have an exam portion to the visit. Some medicare companies allow for a physical, some do not allow a yearly physical. If your medicare allows a yearly physical you can schedule the medicare wellness with our nurse Maudie Mercury and have your physical with your provider after, on the same day. Please check with insurance for your full benefits.   Late Policy/No Shows:  - all new patients should arrive 15-30 minutes earlier than appointment to allow [place] time  to  obtain all personal demographics,  insurance information and  for you to complete office paperwork. - All established patients should arrive 10-15 minutes earlier than appointment time to update all information and be checked in .  - In our best efforts to run on time, if you are late for your appointment you will be asked to either reschedule or if able, we will work you back into the schedule. There will be a wait time to work you back in the schedule,  depending on availability.  - If you are unable to make it to your appointment as scheduled, please call 24 hours ahead of time to allow [place] to fill the time slot with someone else who needs to be seen. If you do not cancel your appointment ahead of time, you may be charged a no show fee.

## 2016-11-20 NOTE — Progress Notes (Signed)
Pre visit review using our clinic review tool, if applicable. No additional management support is needed unless otherwise documented below in the visit note. 

## 2016-11-20 NOTE — Progress Notes (Signed)
Autumn [name] , 04/04/1976, 41 y.o., female MRN: [id number] Patient Care Team    Relationship Specialty Notifications Start End  Ma Hillock, DO PCP - General Family Medicine  01/23/16     CC: Multiple complaints. Subjective: Pt presents for an acute OV with multiple complaints.   Right ankle injury: Patient states she rolled her ankle approximately 10 days ago. She admits that as a younger adult playing sports she has sprained her right ankle on many occasions. She feels that Christmastime however it and she heard a pop. Since that time she has had mild swelling and discomfort both medial and lateral aspects of her ankle. She had tried to wrap it with an Ace bandage, use Advil and ice and she does not feel it is improving.   Right knee/right hip pain/right foot pain: Patient is a runner. She has had a take a break from running secondary to right knee and right hip pain. This occurred approximately 4-5 months ago around the same time she had noticed some pain on the bottom of her right foot. She reports she runs approximately 3-5 miles a day for 5 times a week, when she was running. Her running shoes are proximally 6 months old, but she states she interchanges those between 2 different pairs. She took a break from running to see if that would be helpful for her discomfort, but she would like to get back into running, however increased activity causes her knee and hip to flare. She was treating her foot pain has planar fasciitis completing stretches, however she doesn't feel this improved. And she feels the plantar fasciitis-like symptoms have worsened now that she has had a right ankle injury.  Anxiety/depression: Patient reports a fear of flying. 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 She has been prescribed Xanax in the past for her flight. She denies sedation with use of medication. She is requesting refills today Xanax 0.5 mg when necessary prior to flights. Patient is also  prescribe Zoloft 100 mg daily, by another provider. She reports compliance with this medication.  Allergies  Allergen Reactions  . Augmentin [Amoxicillin-Pot Clavulanate] Hives   Social History  Substance Use Topics  . Smoking status: Former Smoker    Quit date: 11/16/2005  . Smokeless tobacco: Never Used  . Alcohol use No   Past Medical History:  Diagnosis Date  . Depression    Past Surgical History:  Procedure Laterality Date  . TONSILLECTOMY     Family History  Problem Relation Age of Onset  . Aneurysm Neg Hx   . Polycystic kidney disease Neg Hx   . Hyperlipidemia Neg Hx   . Heart attack Neg Hx   . Diabetes Neg Hx   . Depression Other   . Bipolar disorder Other   . Hypertension Mother    Allergies as of 11/20/2016      Reactions   Augmentin [amoxicillin-pot Clavulanate] Hives      Medication List       Accurate as of 11/20/16  9:20 AM. Always use your most recent med list.          albuterol 108 (90 Base) MCG/ACT inhaler Commonly known as:  PROVENTIL HFA;VENTOLIN HFA Inhale 2 puffs into the lungs every 6 (six) hours as needed for wheezing or shortness of breath.   ALPRAZolam 0.5 MG tablet Commonly known as:  XANAX Take one tablet by mouth Prior to flying.   EPINEPHrine 0.3 mg/0.3 mL Soaj  injection Commonly known as:  EPI-PEN Inject 0.3 mLs (0.3 mg total) into the muscle once.   fluticasone 50 MCG/ACT nasal spray Commonly known as:  FLONASE PLACE 2 SPRAYS INTO BOTH NOSTRILS DAILY as needed   sertraline 100 MG tablet Commonly known as:  ZOLOFT Take 1 tablet (100 mg total) by mouth daily.       No results found for this or any previous visit (from the past 24 hour(s)). No results found.   ROS: Negative, with the exception of above mentioned in HPI  Objective:  BP 91/66 (BP Location: Left Arm, Patient Position: Sitting, Cuff Size: Normal)   Pulse 82   Temp 98.6 F (37 C) (Oral)   Resp 16   Ht 5\' 4"  (1.626 m)   Wt 172 lb 8 oz (78.2 kg)   SpO2  97%   BMI 29.61 kg/m  Body mass index is 29.61 kg/m. Gen: Afebrile. No acute distress. Nontoxic in appearance, well developed, well nourished.  HENT: AT. . MMM.  Eyes:Pupils Equal Round Reactive to light, Extraocular movements intact,  Conjunctiva without redness, discharge or icterus. MSK: No erythema, no bruising. Mild soft tissue swelling lateral ankle. Tender to palpation both medial and lateral distal tibia/fibula/ankle. Tender to palpation ligament insertion both medial and lateral ankle. Tender to palpation foot arch and anterior heel. Discomfort and decreased range of motion in plantar flexion/abduction/abduction. Neurovascularly intact distally. Skin: No rashes, purpura or petechiae.  Neuro: Normal gait. PERLA. EOMi. Alert. Oriented x3  Psych: Normal affect, dress and demeanor. Normal speech. Normal thought content and judgment.  Assessment/Plan: DEZZIE AGRAMONTE is a 41 y.o. female present for acute OV for  Need for prophylactic vaccination and inoculation against influenza - Flu Vaccine QUAD 36+ mos IM  Injury of right ankle, initial encounter Sprain of right ankle, unspecified ligament, initial encounter Acute pain of right knee Right hip pain - DG Ankle Complete Right; Future - Proximal and twice a day 7 days, ankle brace worn during activity/waking hours for 7 days. Rest, elevate when able, ice. - Given the patient is a runner, and then only has acute ankle sprain she has had chronic multiple musculoskeletal complaints of the right knee and hip, she would be well suited for sports med referral, and she would like to have one placed today. -Follow-up in 2 weeks, if not in to see sports medicine prior.  Phobia, flying/DEPRESSION/ANXIETY - Zoloft has been refilled already by other provider, no refills needed today. -Xanax use discussed with patient today. Eyes patient Xanax is a controlled substance, and a very addictive medication. - Discussed with her Xanax use only  for phobia to flying. If she is needing more frequently, we need to discuss increasing her Zoloft. She is agreeable to plan. - Patient should be seen every 6 months for her depression/anxiety. It does not appear this was routine for her in the past.  electronically signed by:  Howard Pouch, DO  [facility]

## 2016-11-20 NOTE — Telephone Encounter (Signed)
Left message with xray results and detailed message on patient voice mail per DPR.

## 2016-12-12 NOTE — Progress Notes (Signed)
[name] [address], [place] [address] Phone: [phone] Subjective:    I'm seeing this patient by the request  of:  Howard Pouch, DO   CC: right knee pain right ankle pain   [id number]  Autumn [name] is a 41 y.o. female coming in with complaint of right knee pain.patient is an avid runner. Patient states for the last several months she has been noticing increasing pain that seems to be from the right foot, right knee, and even the right hip. Running approximately 15-20 miles a week. Patient states that this pain didn't get severe enough that she took a break from running. Patient states that anytime she can increases her running the pain seemed to come back. Patient denies any instability. Minimal pain with regular daily activities. Patient like to start increasing activity of the knee but the ankle and the knee seems to be limping  worsening in.   patient has had x-rays of the right foot and ankle that were independently visualized by me. X-rays taken 11/20/2016 shows patient likely had a fracture of the proximal shaft of the fifth metatarsal as well as possible mild deformity of the third and fourth metatarsals.  Past Medical History:  Diagnosis Date  . Bradycardia   . Depression   . Palpitations    Past Surgical History:  Procedure Laterality Date  . TONSILLECTOMY     Social History   Social History  . Marital status: Married    Spouse name: N/A  . Number of children: 1  . 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 Smoking status: Former Smoker    Quit date: 11/16/2005  . Smokeless tobacco: Never Used  . Alcohol use No  . Drug use: No  . Sexual activity: Not Asked   Other Topics Concern  . None   Social History Narrative  . None   Allergies  Allergen Reactions  . Augmentin [Amoxicillin-Pot Clavulanate] Hives   Family History  Problem  Relation Age of Onset  . Depression Other   . Bipolar disorder Other   . Hypertension Mother   . Aneurysm Neg Hx   . Polycystic kidney disease Neg Hx   . Hyperlipidemia Neg Hx   . Heart attack Neg Hx   . Diabetes Neg Hx     Past medical history, social, surgical and family history all reviewed in electronic medical record.  No pertanent information unless stated regarding to the chief complaint.   Review of Systems:Review of systems updated and as accurate as of 12/14/16  No headache, visual changes, nausea, vomiting, diarrhea, constipation, dizziness, abdominal pain, skin rash, fevers, chills, night sweats, weight loss, swollen lymph nodes, body aches, joint swelling, muscle aches, chest pain, shortness of breath, mood changes.   Objective  Blood pressure 122/74, pulse 62, height 5\' 4"  (1.626 m), weight 169 lb (76.7 kg), SpO2 98 %. Systems examined below as of 12/14/16   General: No apparent distress alert and oriented x3 mood and affect normal, dressed appropriately.  HEENT: Pupils equal, extraocular movements intact  Respiratory: Patient's speak in full sentences and does not appear short of breath  Cardiovascular: No lower extremity edema, non tender, no erythema  Skin: Warm dry intact with no signs of infection or rash on extremities or on axial skeleton.  Abdomen: Soft nontender  Neuro: Cranial nerves II through XII are intact, neurovascularly intact in all  extremities with 2+ DTRs and 2+ pulses.  Lymph: No lymphadenopathy of posterior or anterior cervical chain or axillae bilaterally.  Gait normal with good balance and coordination.  MSK:  Non tender with full range of motion and good stability and symmetric strength and tone of shoulders, elbows, wrists bilaterally.  Foot exam shows  Knee: Bilateral Normal to inspection with no erythema or effusion or obvious bony abnormalities. Palpation normal with no warmth, joint line tenderness, patellar tenderness, or condyle  tenderness. ROM full in flexion and extension and lower leg rotation. Ligaments with solid consistent endpoints including ACL, PCL, LCL, MCL. Negative Mcmurray's, Apley's, and Thessalonian tests. Very mild painful patellar compression. Patellar glide mild crepitus. Patellar and quadriceps tendons unremarkable. Hamstring and quadriceps strength is normal.   Ankle: Right  Mild supination of the foot noted. Range of motion is full in all directions. 4/ 5 strength of the peroneals compared to the contralateral side. Stable lateral and medial ligaments; squeeze test and kleiger test unremarkable; Talar dome nontender; No pain at base of 5th MT; No tenderness over cuboid; No tenderness over N spot or navicular prominence No tenderness on posterior aspects of lateral and medial malleolus Pain over the peroneal tendon but no true subluxation noted. Mild gapping with anterior drawer test. Negative tarsal tunnel tinel's Able to walk 4 steps.  Procedure note [id number]; 15 minutes spent for Therapeutic exercises as stated in above notes.  This included exercises focusing on stretching, strengthening, with significant focus on eccentric aspects.  Ankle strengthening that included:  Basic range of motion exercises to allow proper full motion at ankle Stretching of the lower leg and hamstrings  Theraband exercises for the lower leg - inversion, eversion, dorsiflexion and plantarflexion each to be completed with a theraband Balance exercises to increase proprioception Weight bearing exercises to increase strength and balance  Proper technique shown and discussed handout in great detail with ATC.  All questions were discussed and answered.     Impression and Recommendations:     This case required medical decision making of moderate complexity.      Note: This dictation was prepared with Dragon dictation along with smaller phrase technology. Any transcriptional errors that result from this  process are unintentional.

## 2016-12-14 ENCOUNTER — Ambulatory Visit: Payer: Self-pay

## 2016-12-14 ENCOUNTER — Ambulatory Visit (INDEPENDENT_AMBULATORY_CARE_PROVIDER_SITE_OTHER): Payer: PRIVATE HEALTH INSURANCE | Admitting: Family Medicine

## 2016-12-14 ENCOUNTER — Encounter: Payer: Self-pay | Admitting: Family Medicine

## 2016-12-14 VITALS — BP 122/74 | HR 62 | Ht 64.0 in | Wt 169.0 lb

## 2016-12-14 DIAGNOSIS — M25571 Pain in right ankle and joints of right foot: Secondary | ICD-10-CM | POA: Diagnosis not present

## 2016-12-14 DIAGNOSIS — M25561 Pain in right knee: Secondary | ICD-10-CM

## 2016-12-14 DIAGNOSIS — M7671 Peroneal tendinitis, right leg: Secondary | ICD-10-CM | POA: Diagnosis not present

## 2016-12-14 DIAGNOSIS — S93401A Sprain of unspecified ligament of right ankle, initial encounter: Secondary | ICD-10-CM

## 2016-12-14 MED ORDER — DICLOFENAC SODIUM 2 % TD SOLN: 2.0000 g | Freq: Two times a day (BID) | TRANSDERMAL | 3 refills | Status: DC

## 2016-12-14 MED ORDER — VITAMIN D (ERGOCALCIFEROL) 1.25 MG (50000 UNIT) PO CAPS: 50000.0000 [IU] | ORAL_CAPSULE | ORAL | 0 refills | Status: DC

## 2016-12-14 NOTE — Patient Instructions (Signed)
Good to see you  Ice 20 minutes 2 times daily. Usually after activity and before bed. pennsaid pinkie amount topically 2 times daily as needed.  Once weekly vitamin D for next 12 weeks.  Exercises 3 times a week.  Biking or elliptical would be better Good shoes with rigid bottom.  Jalene Mullet, [place]rell or New balance greater then 700 Try not to be barefoot.  See me again in 4 weeks.

## 2016-12-14 NOTE — Assessment & Plan Note (Signed)
Likely is more secondary to the chronic problem with her ankle. Discussed with patient at great length. Did not find any signs of any meniscal injury. Likely some mild patellofemoral. Home exercises given. Follow-up again in 4 weeks.

## 2016-12-14 NOTE — Assessment & Plan Note (Signed)
Patient does have more of a chronic ankle sprain. Injury to the ATFL. 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 We discussed with patient at great length. Topical anti-inflammatories, once weekly vitamin D for strength. Given home exercises that I think will be beneficial. Discussed proper shoes. I believe that this malalignment is causing more of the knee and hip pain as well. Patient will try to increase activity as tolerated. Follow-up with me again in 4 weeks.

## 2017-01-08 ENCOUNTER — Encounter: Payer: Self-pay | Admitting: Family Medicine

## 2017-01-08 ENCOUNTER — Ambulatory Visit (INDEPENDENT_AMBULATORY_CARE_PROVIDER_SITE_OTHER): Payer: PRIVATE HEALTH INSURANCE | Admitting: Family Medicine

## 2017-01-08 VITALS — BP 94/64 | HR 61 | Temp 98.5°F | Resp 20 | Wt 171.0 lb

## 2017-01-08 DIAGNOSIS — R21 Rash and other nonspecific skin eruption: Secondary | ICD-10-CM | POA: Diagnosis not present

## 2017-01-08 MED ORDER — TRIAMCINOLONE ACETONIDE 0.1 % EX CREA: 1.0000 "application " | TOPICAL_CREAM | Freq: Two times a day (BID) | CUTANEOUS | 0 refills | Status: DC

## 2017-01-08 NOTE — Progress Notes (Signed)
Autumn [name] , Jul 29, 1976, 41 y.o., female MRN: [id number] Patient Care Team    Relationship Specialty Notifications Start End  Ma Hillock, DO PCP - General Family Medicine  01/23/16     CC: Rash Subjective: Pt presents for an acute OV with complaints of rash of 1.5 months duration.  Associated symptoms include redness and itching. Patient states that approximately mid-January she noticed a rash on her bilateral ankles. She thought it was maybe some poison ivy, and started using over-the-counter hydrocortisone cream, which improved the rash and it almost went away. Since that time he has intermittently returned and she use the cream again and it had made it better but it has not gone away. This week it started to come worse, with burning and itching. She denies any spreading of the rash to other locations of her body. She denies any drainage or weeping of the area. She does not recall any new shoes/boots etc. she could've been exposed to.  Allergies  Allergen Reactions  . Augmentin [Amoxicillin-Pot Clavulanate] Hives   Social History  Substance Use Topics  . Smoking status: Former Smoker    Quit date: 11/16/2005  . Smokeless tobacco: Never Used  . Alcohol use No   Past Medical History:  Diagnosis Date  . Bradycardia   . Depression   . Palpitations    Past Surgical History:  Procedure Laterality Date  . TONSILLECTOMY     Family History  Problem Relation Age of Onset  . Depression Other   . Bipolar disorder Other   . Hypertension Mother   . Aneurysm Neg Hx   . Polycystic kidney disease Neg Hx   . Hyperlipidemia Neg Hx   . Heart attack Neg Hx   . Diabetes Neg Hx    Allergies as of 01/08/2017      Reactions   Augmentin [amoxicillin-pot Clavulanate] Hives      Medication List       Accurate as of 01/08/17  9:36 AM. Always use your most recent med list.          albuterol 108 (90 Base) MCG/ACT inhaler Commonly known as:  PROVENTIL HFA;VENTOLIN HFA Inhale 2  puffs into the lungs every 6 (six) hours as needed for wheezing or shortness of breath.   ALPRAZolam 0.5 MG tablet Commonly known as:  XANAX Take one tablet by mouth Prior to flying.   EPINEPHrine 0.3 mg/0.3 mL Soaj injection Commonly known as:  EPI-PEN Inject 0.3 mLs (0.3 mg total) into the muscle once.   fluticasone 50 MCG/ACT nasal spray Commonly known as:  FLONASE PLACE 2 SPRAYS INTO BOTH NOSTRILS DAILY as needed   naproxen 500 MG tablet Commonly known as:  NAPROSYN Take 1 tablet (500 mg total) by mouth 2 (two) times daily with a meal.   sertraline 100 MG tablet Commonly known as:  ZOLOFT Take 1 tablet (100 mg total) by mouth daily.   Vitamin D (Ergocalciferol) 50000 units Caps capsule Commonly known as:  DRISDOL Take 1 capsule (50,000 Units total) by mouth every 7 (seven) days.       No results found for this or any previous visit (from the past 24 hour(s)). No results found.   ROS: Negative, with the exception of above mentioned in HPI   Objective:  BP 94/64 (BP Location: Right Arm, Patient Position: Sitting, Cuff Size: Normal)   Pulse 61   Temp 98.5 F (36.9 C)   Resp 20   Wt 171 lb (77.6 kg)  SpO2 98%   BMI 29.35 kg/m  Body mass index is 29.35 kg/m. Gen: Afebrile. No acute distress. Nontoxic in appearance, well developed, well nourished.  Skin: Erythremic base, raised, mildly scaly rash around bilateral ankles. No vesicles or drainage appreciated. No petechiae or purpura.  Assessment/Plan: Autumn [name] is a 41 y.o. female present for acute OV for  1. Rash - Uncertain etiology, giving bilateral presentation, no spreading likely a dermatitis of some nature. Considered fungal as well. However it has been improving with hydrocortisone cream. - We will try a higher dose steroid cream twice a day for 2 weeks, if not resolved in 2 weeks may consider biopsy. If worsening with steroid cream, consider trial of antifungal. - triamcinolone cream (KENALOG)  0.1 %; Apply 1 application topically 2 (two) times daily.  Dispense: 30 g; Refill: 0 - Follow-up 2 weeks if not improving or resolved.   electronically signed by:  Howard Pouch, DO  [place]

## 2017-01-08 NOTE — Patient Instructions (Addendum)
Twice a day cream, 2 weeks. If worsens call me, we will try antifungal cream.  If better but not resolved in 2 weeks, may do bx or increase steroid.

## 2017-01-10 NOTE — Progress Notes (Signed)
[name] [address] [place], [place] [address] Phone: [phone] Subjective:    I'm seeing this patient by the request  of:  Howard Pouch, DO   CC: right knee pain right ankle pain f/u   [id number]  Autumn [name] is a 41 y.o. female coming in with complaint of right knee pain.patient is an avid runner. Patient was seen previously was found to have more of a lateral column overload. Patient was to get over-the-counter orthotics, start home exercises, icing regimen. Patient was to try once weekly vitamin D and topical anti-inflammatories. Patient states Ankle still gives her some pain. Patient did go to [employer] and do a lot of walking. States that certain steps can cause a sharp discomfort. Seems that he can even she has some weakness. Patient is now stating that more of her back and hip seems to be the worse. States that even at night it is waking her up. Patient states over-the-counter medications are not helping. Describes the pain as a dull, throbbing aching sensation. States that there is some mild tingling or muscle numbness that happens at night that seems to go down past her knee. Seems to be very intermittent. He seems to be stable. An states no improvement no worsening.     Past Medical History:  Diagnosis Date  . Bradycardia   . Depression   . Palpitations    Past Surgical History:  Procedure Laterality Date  . TONSILLECTOMY     Social History   Social History  . Marital status: Married    Spouse name: N/A  . Number of children: 1  . 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 Smoking status: Former Smoker    Quit date: 11/16/2005  . Smokeless tobacco: Never Used  . Alcohol use No  . Drug use: No  . Sexual activity: Not Asked   Other Topics Concern  . None   Social History Narrative  . None   Allergies  Allergen Reactions  . Augmentin  [Amoxicillin-Pot Clavulanate] Hives   Family History  Problem Relation Age of Onset  . Depression Other   . Bipolar disorder Other   . Hypertension Mother   . Aneurysm Neg Hx   . Polycystic kidney disease Neg Hx   . Hyperlipidemia Neg Hx   . Heart attack Neg Hx   . Diabetes Neg Hx     Past medical history, social, surgical and family history all reviewed in electronic medical record.  No pertanent information unless stated regarding to the chief complaint.   Review of Systems: No headache, visual changes, nausea, vomiting, diarrhea, constipation, dizziness, abdominal pain, skin rash, fevers, chills, night sweats, weight loss, swollen lymph nodes, body aches, joint swelling, muscle aches, chest pain, shortness of breath, mood changes.    Objective  Blood pressure 110/70, pulse 76, height 5\' 4"  (1.626 m), weight 174 lb (78.9 kg), SpO2 97 %.   Systems examined below as of 01/11/17 General: NAD A&O x3 mood, affect normal  HEENT: Pupils equal, extraocular movements intact no nystagmus Respiratory: not short of breath at rest or with speaking Cardiovascular: No lower extremity edema, non tender Skin: Warm dry intact with no signs of infection or rash on extremities or on axial skeleton. Abdomen: Soft nontender, no masses Neuro: Cranial nerves  intact, neurovascularly intact in all extremities with 2+ DTRs and 2+ pulses. Lymph: No lymphadenopathy appreciated  today  Gait normal with good balance and coordination.  MSK: Non tender with full range of motion and good stability and symmetric strength and tone of shoulders, elbows, wrist,  hips bilaterally.    Knee: Bilateral Normal to inspection with no erythema or effusion or obvious bony abnormalities. Palpation normal with no warmth, joint line tenderness, patellar tenderness, or condyle tenderness. ROM full in flexion and extension and lower leg rotation. Ligaments with solid consistent endpoints including ACL, PCL, LCL, MCL. Negative  Mcmurray's, Apley's, and Thessalonian tests. Continued mild patella compression pain Patellar glide mild crepitus. Patellar and quadriceps tendons unremarkable. Hamstring and quadriceps strength is normal.  Minimal change from previous exam  Ankle: Right  Mild supination of the foot noted. Range of motion is full in all directions. 4/ 5 strength of the peroneals compared to the contralateral side. Stable lateral and medial ligaments; squeeze test and kleiger test unremarkable; Talar dome mild tenderness noted; No pain at base of 5th MT; No tenderness over cuboid; No tenderness over N spot or navicular prominence No tenderness on posterior aspects of lateral and medial malleolus Pain over the peroneal tendon but no true subluxation noted. Mild gapping with anterior drawer test. Negative tarsal tunnel tinel's Able to walk 4 steps. Minimal change from previous exam possibly mild worsening  Back Exam:  Inspection: Unremarkable  Motion: Flexion 45 deg, Extension 25 deg, Side Bending to 45 deg bilaterally,  Rotation to 45 deg bilaterally  SLR laying: Negative significant discomfort though patient states on the right side XSLR laying: Negative  Palpable tenderness: Tender to palpation appears palmar musculature of the lumbar spine right greater than left. Pain over the piriformis. FABER: Positive right. Sensory change: Gross sensation intact to all lumbar and sacral dermatomes.  Reflexes: 2+ at both patellar tendons, 2+ at achilles tendons, Babinski's downgoing.  Strength at foot  Plantar-flexion: 5/5 Dorsi-flexion: 5/5 Eversion: 5/5 Inversion: 5/5  Leg strength  Quad: 5/5 Hamstring: 5/5 Hip flexor: 5/5 Hip abductors: 5/5  Gait unremarkable.  Osteopathic findings T3 extended rotated and side bent right inhaled third rib T9 extended rotated and side bent left L2 flexed rotated and side bent right Sacrum right on right      Impression and Recommendations:     This case  required medical decision making of moderate complexity.      Note: This dictation was prepared with Dragon dictation along with smaller phrase technology. Any transcriptional errors that result from this process are unintentional.

## 2017-01-11 ENCOUNTER — Ambulatory Visit (INDEPENDENT_AMBULATORY_CARE_PROVIDER_SITE_OTHER)
Admission: RE | Admit: 2017-01-11 | Discharge: 2017-01-11 | Disposition: A | Discharge status: Home / Self Care | Payer: PRIVATE HEALTH INSURANCE | Source: Ambulatory Visit | Attending: Family Medicine | Admitting: Family Medicine

## 2017-01-11 ENCOUNTER — Ambulatory Visit (INDEPENDENT_AMBULATORY_CARE_PROVIDER_SITE_OTHER): Payer: PRIVATE HEALTH INSURANCE | Admitting: Family Medicine

## 2017-01-11 ENCOUNTER — Encounter: Payer: Self-pay | Admitting: Family Medicine

## 2017-01-11 VITALS — BP 110/70 | HR 76 | Ht 64.0 in | Wt 174.0 lb

## 2017-01-11 DIAGNOSIS — M7671 Peroneal tendinitis, right leg: Secondary | ICD-10-CM

## 2017-01-11 DIAGNOSIS — M999 Biomechanical lesion, unspecified: Secondary | ICD-10-CM | POA: Insufficient documentation

## 2017-01-11 DIAGNOSIS — M5416 Radiculopathy, lumbar region: Secondary | ICD-10-CM

## 2017-01-11 MED ORDER — GABAPENTIN 100 MG PO CAPS: 200.0000 mg | ORAL_CAPSULE | Freq: Every day | ORAL | 3 refills | Status: DC

## 2017-01-11 MED ORDER — PREDNISONE 50 MG PO TABS: 50.0000 mg | ORAL_TABLET | Freq: Every day | ORAL | 0 refills | Status: DC

## 2017-01-11 NOTE — Assessment & Plan Note (Signed)
Continued monitor. We discussed the possibility of formal physical therapy or injection. Patient declined. Patient can have possible talar dome injury and we do not see anything on x-ray previously. We discussed icing regimen. Patient will continue to stay active. Patient will come back and see me again weeks. Differential includes a lumbar radiculopathy with findings noted today. Talar dome injury is within the differential and may need to consider possible MRI.

## 2017-01-11 NOTE — Assessment & Plan Note (Signed)
Patient is having signs and symptoms that could be lumbar radiculopathy.Weakness noted today but patient does have some positive straight leg test. Patient's will continue with the medications. Started on prednisone as well as gabapentin. Responded fairly well to osteopathic manipulation today. We'll watch for any type of chronic, constant numbness or any weakness. Follow-up again in 10-14 days.

## 2017-01-11 NOTE — Assessment & Plan Note (Signed)
Decision today to treat with OMT was based on Physical Exam  After verbal consent patient was treated with HVLA, ME, FPR techniques in  thoracic, lumbar and sacral areas  Patient tolerated the procedure well with improvement in symptoms  Patient given exercises, stretches and lifestyle modifications  See medications in patient instructions if given  Patient will follow up in 2 weeks

## 2017-01-11 NOTE — Patient Instructions (Signed)
Goo to see you  Autumn [name] is your friend.  Keep working on the ankle We will calm down the back to see how this goes.  Prednisone daily for 5 days.  Gabapentin 200mg  at night Xray downstairs.  See me again in 10-14 days to make sure we are making progress.

## 2017-01-24 NOTE — Progress Notes (Signed)
[name] [address] [place], [place] [address] Phone: [phone] Subjective:    I'm seeing this patient by the request  of:  Howard Pouch, DO   CC: right knee pain right ankle pain f/u neck pain follow-up  [id number]  Autumn [name] is a 41 y.o. female coming in with complaint of right ankle pain.patient is an avid runner. Patient was seen previously was found to have more of a lateral column overload. Patient was to get over-the-counter orthotics, start home exercises, icing regimen. Patient was to try once weekly vitamin D and topical anti-inflammatories. Patient at last exam was making some improvement. Patient states much better at this time.  States 90%  Better.  No new symptoms of the ankle.    Patient also was having some signs of a lumbar radiculopathy. Patient was started on gabapentin, patient also responding fairly well to osteopathic manipulation. Was to start home exercises. Patient states Doing significantly better overall. Still some mild soreness but nothing severe. Taking the gabapentin intermittently. Denies any numbness, radiation of pain that she was having previously.     Past Medical History:  Diagnosis Date  . Bradycardia   . Depression   . Palpitations    Past Surgical History:  Procedure Laterality Date  . TONSILLECTOMY     Social History   Social History  . Marital status: Married    Spouse name: N/A  . Number of children: 1  . 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 Smoking status: Former Smoker    Quit date: 11/16/2005  . Smokeless tobacco: Never Used  . Alcohol use No  . Drug use: No  . Sexual activity: Not on file   Other Topics Concern  . Not on file   Social History Narrative  . No narrative on file   Allergies  Allergen Reactions  . Augmentin [Amoxicillin-Pot Clavulanate] Hives   Family History  Problem Relation Age  of Onset  . Depression Other   . Bipolar disorder Other   . Hypertension Mother   . Aneurysm Neg Hx   . Polycystic kidney disease Neg Hx   . Hyperlipidemia Neg Hx   . Heart attack Neg Hx   . Diabetes Neg Hx     Past medical history, social, surgical and family history all reviewed in electronic medical record.  No pertanent information unless stated regarding to the chief complaint.   Review of Systems: No headache, visual changes, nausea, vomiting, diarrhea, constipation, dizziness, abdominal pain, skin rash, fevers, chills, night sweats, weight loss, swollen lymph nodes, body aches, joint swelling, muscle aches, chest pain, shortness of breath, mood changes.     Objective  There were no vitals taken for this visit.   Systems examined below as of 01/25/17 General: NAD A&O x3 mood, affect normal  HEENT: Pupils equal, extraocular movements intact no nystagmus Respiratory: not short of breath at rest or with speaking Cardiovascular: No lower extremity edema, non tender Skin: Warm dry intact with no signs of infection or rash on extremities or on axial skeleton. Abdomen: Soft nontender, no masses Neuro: Cranial nerves  intact, neurovascularly intact in all extremities with 2+ DTRs and 2+ pulses. Lymph: No lymphadenopathy appreciated today  Gait normal with good balance and coordination.  MSK: Non tender with full range of motion and good stability and symmetric strength and tone of shoulders, elbows, wrist,  knee hips  and ankles bilaterally.      Ankle: Right  Mild supination of the foot noted. Range of motion is full in all directions. Improved strength from previous exam Talar dome mild tenderness noted; No pain at base of 5th MT; No tenderness over cuboid; No tenderness over N spot or navicular prominence No tenderness on posterior aspects of lateral and medial malleolus Animal pain over the peroneal tendon Negative tarsal tunnel tinel's Able to walk 4 steps.   Back  Exam:  Inspection: Unremarkable  Motion: Flexion 45 deg, Extension 25 deg, Side Bending to 45 deg bilaterally,  Rotation to 45 deg bilaterally  SLR laying: Negative  XSLR laying: Negative  Palpable tenderness: Tender to palpation in the paraspinal musculature lumbar spine. FABER: Mild tightness on the right side. Sensory change: Gross sensation intact to all lumbar and sacral dermatomes.  Reflexes: 2+ at both patellar tendons, 2+ at achilles tendons, Babinski's downgoing.  Strength at foot  Plantar-flexion: 5/5 Dorsi-flexion: 5/5 Eversion: 5/5 Inversion: 5/5  Leg strength  Quad: 5/5 Hamstring: 5/5 Hip flexor: 5/5 Hip abductors: 5/5  Gait unremarkable. .  Osteopathic findings T3 extended rotated and side bent right inhaled third rib T8 extended rotated and side bent left L3 flexed rotated and side bent right Sacrum right on right       Impression and Recommendations:     This case required medical decision making of moderate complexity.      Note: This dictation was prepared with Dragon dictation along with smaller phrase technology. Any transcriptional errors that result from this process are unintentional.

## 2017-01-25 ENCOUNTER — Encounter: Payer: Self-pay | Admitting: Family Medicine

## 2017-01-25 ENCOUNTER — Ambulatory Visit (INDEPENDENT_AMBULATORY_CARE_PROVIDER_SITE_OTHER): Payer: PRIVATE HEALTH INSURANCE | Admitting: Family Medicine

## 2017-01-25 VITALS — BP 102/62 | HR 87 | Ht 64.0 in | Wt 172.5 lb

## 2017-01-25 DIAGNOSIS — M5416 Radiculopathy, lumbar region: Secondary | ICD-10-CM

## 2017-01-25 DIAGNOSIS — M999 Biomechanical lesion, unspecified: Secondary | ICD-10-CM | POA: Diagnosis not present

## 2017-01-25 NOTE — Assessment & Plan Note (Signed)
Patient though longer is having significant radiculopathy at this time. Patient seems to be doing much better. No longer taking the prednisone but is taking the gabapentin intermittently. Encourage her to continue to take the once weekly vitamin D. Given home exercises for her back. We discussed which activities to potentially avoid. Follow-up again in 4-6 weeks.

## 2017-01-25 NOTE — Patient Instructions (Signed)
Great to see you  I am so glad you are doing better.  We got you all better now the work is on you.  New exercises for the back 2-3 times a week.  Stay active Gabapentin at night when you needed See me again in 4-6 weeks.

## 2017-01-25 NOTE — Assessment & Plan Note (Signed)
Decision today to treat with OMT was based on Physical Exam  After verbal consent patient was treated with HVLA, ME, FPR techniques in  thoracic, lumbar and sacral areas  Patient tolerated the procedure well with improvement in symptoms  Patient given exercises, stretches and lifestyle modifications  See medications in patient instructions if given  Patient will follow up in 4-6 weeks 

## 2017-02-15 ENCOUNTER — Telehealth: Payer: Self-pay

## 2017-02-15 MED ORDER — GABAPENTIN 100 MG PO CAPS: 200.0000 mg | ORAL_CAPSULE | Freq: Every day | ORAL | 3 refills | Status: DC

## 2017-02-15 NOTE — Telephone Encounter (Signed)
Gabapentin 100 mg requesting 90 day supply LOV 01/25/2017 New OV 02/22/2017 Last refilled 01/11/2017 #60

## 2017-02-15 NOTE — Telephone Encounter (Signed)
noted 

## 2017-02-21 NOTE — Progress Notes (Signed)
Autumn [name], [name] [address] Phone: [phone] Subjective:    I'm seeing this patient by the request  of:  Howard Pouch, DO   CC: right knee pain right ankle pain f/u neck pain follow-up  [id number]  Autumn [name] is a 41 y.o. female coming in with complaint of right ankle pain.patient is an avid runner. Patient was seen previously was found to have more of a lateral column overload. Seems stable overall. Did do a lot of walking and had some mild swelling. Nothing that is severe. States that has gotten better. Notices when she wears better shoes seems to do better.  No new symptoms of the ankle.    Patient also was having some signs of a lumbar radiculopathy. Patient was started on gabapentin, patient has done well with the osteopathic manipulation. 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 Not having any radicular symptoms. Patient states that has been feeling relatively well overall except for some increasing tightness. Seems very similar to what it was prior to the manipulation. Just worsening symptoms again..     Past Medical History:  Diagnosis Date  . Bradycardia   . Depression   . Palpitations    Past Surgical History:  Procedure Laterality Date  . TONSILLECTOMY     Social History   Social History  . Marital status: Married    Spouse name: N/A  . Number of children: 1  . 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 Smoking status: Former Smoker    Quit date: 11/16/2005  . Smokeless tobacco: Never Used  . Alcohol use No  . Drug use: No  . Sexual activity: Not Asked   Other Topics Concern  . None   Social History Narrative  . None   Allergies  Allergen Reactions  . Augmentin [Amoxicillin-Pot Clavulanate] Hives   Family History  Problem Relation Age of Onset  . Depression Other   . Bipolar disorder Other   . Hypertension Mother   .  Aneurysm Neg Hx   . Polycystic kidney disease Neg Hx   . Hyperlipidemia Neg Hx   . Heart attack Neg Hx   . Diabetes Neg Hx     Past medical history, social, surgical and family history all reviewed in electronic medical record.  No pertanent information unless stated regarding to the chief complaint.  Review of Systems: No headache, visual changes, nausea, vomiting, diarrhea, constipation, dizziness, abdominal pain, skin rash, fevers, chills, night sweats, weight loss, swollen lymph nodes, body aches, joint swelling, muscle aches, chest pain, shortness of breath, mood changes.     Objective  Blood pressure 126/72, pulse 72, resp. rate 16, weight 175 lb (79.4 kg), SpO2 98 %.   Systems examined below as of 02/22/17 General: NAD A&O x3 mood, affect normal  HEENT: Pupils equal, extraocular movements intact no nystagmus Respiratory: not short of breath at rest or with speaking Cardiovascular: No lower extremity edema, non tender Skin: Warm dry intact with no signs of infection or rash on extremities or on axial skeleton. Abdomen: Soft nontender, no masses Neuro: Cranial nerves  intact, neurovascularly intact in all extremities with 2+ DTRs and 2+ pulses. Lymph: No lymphadenopathy appreciated today  Gait normal with good balance and coordination.  MSK: Non tender with full range of motion and good stability and symmetric strength and tone of shoulders, elbows, wrist,  knee hips  and ankles bilaterally.       Back Exam:  Inspection: Unremarkable  Motion: Flexion 45 deg, Extension 25 deg, Side Bending to 45 deg bilaterally,  Rotation to 45 deg bilaterally similar range of motion from previous exam SLR laying: Negative  XSLR laying: Negative  Palpable tenderness: Very mild discomfort noted in the thoracolumbar juncture as well as the lumbar sacral juncture mostly on the right side. FABER: Mild tightness on the right side. No radicular symptoms Sensory change: Gross sensation intact to all  lumbar and sacral dermatomes.  Reflexes: 2+ at both patellar tendons, 2+ at achilles tendons, Babinski's downgoing.  Strength at foot  Plantar-flexion: 5/5 Dorsi-flexion: 5/5 Eversion: 5/5 Inversion: 5/5  Leg strength  Quad: 5/5 Hamstring: 5/5 Hip flexor: 5/5 Hip abductors: 5/5  Gait unremarkable. .  Osteopathic findings T3 extended rotated and side bent right inhaled third rib T7 extended rotated and side bent left L2 flexed rotated and side bent right Sacrum right on right        Impression and Recommendations:     This case required medical decision making of moderate complexity.      Note: This dictation was prepared with Dragon dictation along with smaller phrase technology. Any transcriptional errors that result from this process are unintentional.

## 2017-02-22 ENCOUNTER — Ambulatory Visit (INDEPENDENT_AMBULATORY_CARE_PROVIDER_SITE_OTHER): Payer: PRIVATE HEALTH INSURANCE | Admitting: Family Medicine

## 2017-02-22 ENCOUNTER — Encounter: Payer: Self-pay | Admitting: Family Medicine

## 2017-02-22 VITALS — BP 126/72 | HR 72 | Resp 16 | Wt 175.0 lb

## 2017-02-22 DIAGNOSIS — M7671 Peroneal tendinitis, right leg: Secondary | ICD-10-CM

## 2017-02-22 DIAGNOSIS — M5416 Radiculopathy, lumbar region: Secondary | ICD-10-CM | POA: Diagnosis not present

## 2017-02-22 DIAGNOSIS — M999 Biomechanical lesion, unspecified: Secondary | ICD-10-CM | POA: Diagnosis not present

## 2017-02-22 NOTE — Progress Notes (Signed)
Pre-visit discussion using our clinic review tool. No additional management support is needed unless otherwise documented below in the visit note.  

## 2017-02-22 NOTE — Assessment & Plan Note (Signed)
Decision today to treat with OMT was based on Physical Exam  After verbal consent patient was treated with HVLA, ME, FPR techniques in cervical, thoracic, lumbar and sacral areas  Patient tolerated the procedure well with improvement in symptoms  Patient given exercises, stretches and lifestyle modifications  See medications in patient instructions if given  Patient will follow up in 6 weeks 

## 2017-02-22 NOTE — Patient Instructions (Signed)
Good to see you  Autumn [name] is your friend.  Stay active The hip flexor after running Ask insurance about orthotics [phone])  See me again in 4-6 weeks.

## 2017-02-22 NOTE — Assessment & Plan Note (Signed)
No significant radicular symptoms. Discussed with patient at great length. We discussed icing regimen and home exercises. Discussed which activities doing which ones to avoid. Patient will increase activity as tolerated. We discussed core strengthening. Follow-up again in 4 weeks

## 2017-02-22 NOTE — Assessment & Plan Note (Signed)
Stable. Patient will be checking with insurance company to see if custom orthotics would be possible. Therefore, I would like her to get set up in the near future.

## 2017-02-23 ENCOUNTER — Encounter: Payer: Self-pay | Admitting: Family Medicine

## 2017-02-23 ENCOUNTER — Encounter: Payer: Self-pay | Admitting: *Deleted

## 2017-03-01 ENCOUNTER — Ambulatory Visit (INDEPENDENT_AMBULATORY_CARE_PROVIDER_SITE_OTHER): Payer: No Typology Code available for payment source | Admitting: Family Medicine

## 2017-03-01 ENCOUNTER — Encounter: Payer: Self-pay | Admitting: Family Medicine

## 2017-03-01 VITALS — BP 106/73 | HR 66 | Temp 98.6°F | Resp 20 | Ht 64.0 in | Wt 174.0 lb

## 2017-03-01 DIAGNOSIS — F341 Dysthymic disorder: Secondary | ICD-10-CM

## 2017-03-01 DIAGNOSIS — F40243 Fear of flying: Secondary | ICD-10-CM | POA: Diagnosis not present

## 2017-03-01 MED ORDER — SERTRALINE HCL 100 MG PO TABS: 100.0000 mg | ORAL_TABLET | Freq: Every day | ORAL | 1 refills | Status: DC

## 2017-03-01 MED ORDER — ALPRAZOLAM 0.5 MG PO TABS: ORAL_TABLET | ORAL | 3 refills | Status: DC

## 2017-03-01 NOTE — Progress Notes (Signed)
Autumn [name] , 1975/12/01, 41 y.o., female MRN: [id number] Patient Care Team    Relationship Specialty Notifications Start End  Ma Hillock, DO PCP - General Family Medicine  01/23/16     CC: Flight phobia Subjective: Pt presents for an acute OV with flight phobia.  Anxiety/depression: Patient presents today for refills on xanax she uses for her fear of flying. 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 She is in [place] for [employer]. She has been prescribed Xanax in the past for her flight. She denies sedation on medication. She is requesting refills today Xanax 0.5 mg when necessary prior to flights. Patient is also prescribe Zoloft 100 mg daily, for depression and is doing well on this medication without side effects. She reports compliance with zoloft daily.  Depression screen [facility] 2/9 03/01/2017  Decreased Interest 0  Down, Depressed, Hopeless 0  PHQ - 2 Score 0     Allergies  Allergen Reactions  . Augmentin [Amoxicillin-Pot Clavulanate] Hives   Social History  Substance Use Topics  . Smoking status: Former Smoker    Quit date: 11/16/2005  . Smokeless tobacco: Never Used  . Alcohol use No   Past Medical History:  Diagnosis Date  . Bradycardia   . Depression   . Palpitations    Past Surgical History:  Procedure Laterality Date  . TONSILLECTOMY     Family History  Problem Relation Age of Onset  . Depression Other   . Bipolar disorder Other   . Hypertension Mother   . Aneurysm Neg Hx   . Polycystic kidney disease Neg Hx   . Hyperlipidemia Neg Hx   . Heart attack Neg Hx   . Diabetes Neg Hx    Allergies as of 03/01/2017      Reactions   Augmentin [amoxicillin-pot Clavulanate] Hives      Medication List       Accurate as of 03/01/17  8:19 AM. Always use your most recent med list.          albuterol 108 (90 Base) MCG/ACT inhaler Commonly known as:  PROVENTIL HFA;VENTOLIN HFA Inhale 2 puffs into the lungs every 6 (six) hours as  needed for wheezing or shortness of breath.   ALPRAZolam 0.5 MG tablet Commonly known as:  XANAX Take one tablet by mouth Prior to flying.   EPINEPHrine 0.3 mg/0.3 mL Soaj injection Commonly known as:  EPI-PEN Inject 0.3 mLs (0.3 mg total) into the muscle once.   fluticasone 50 MCG/ACT nasal spray Commonly known as:  FLONASE PLACE 2 SPRAYS INTO BOTH NOSTRILS DAILY as needed   sertraline 100 MG tablet Commonly known as:  ZOLOFT Take 1 tablet (100 mg total) by mouth daily.   triamcinolone cream 0.1 % Commonly known as:  KENALOG Apply 1 application topically 2 (two) times daily.   Vitamin D (Ergocalciferol) 50000 units Caps capsule Commonly known as:  DRISDOL Take 1 capsule (50,000 Units total) by mouth every 7 (seven) days.       No results found for this or any previous visit (from the past 24 hour(s)). No results found.   ROS: Negative, with the exception of above mentioned in HPI  Objective:  BP 106/73 (BP Location: Right Arm, Patient Position: Sitting, Cuff Size: Normal)   Pulse 66   Temp 98.6 F (37 C)   Resp 20   Ht 5\' 4"  (1.626 m)   Wt 174 lb (78.9 kg)   SpO2 98%   BMI  29.87 kg/m  Body mass index is 29.87 kg/m. Gen: Afebrile. No acute distress.  HENT: AT. [place].MMM. Eyes:Pupils Equal Round Reactive to light, Extraocular movements intact,  Conjunctiva without redness, discharge or icterus. CV: RRR  Psych: Normal affect, dress and demeanor. Normal speech. Normal thought content and judgment..    Assessment/Plan: Autumn [name] is a 41 y.o. female present for acute OV for  Phobia, flying/DEPRESSION/ANXIETY - Continue Zoloft 100 QD, refills provided today.  -Xanax use discussed with patient today.  - Controlled substance agreement signed today.  - Xanax use only for phobia to flying. She is in understanding.  - f/u 6 mos.   electronically signed by:  Howard Pouch, DO  [place]

## 2017-03-01 NOTE — Patient Instructions (Addendum)
It was great seeing you today!  Have a great time on your trips!!

## 2017-03-30 NOTE — Progress Notes (Deleted)
[name] [address] [place], [place] [address] Phone: [phone] Subjective:    I'm seeing this patient by the request  of:  Kuneff, Renee A, DO   CC: right knee pain right ankle pain f/u neck pain follow-up  PFX:[id number]  Autumn [name] is a 41 y.o. female coming in with complaint of right ankle pain.patient is an avid runner. Patient was seen previously was found to have more of a lateral column overload. Seems stable overall. Did do a lot of walking and had some mild swelling. Nothing that is severe. States that has gotten better. Notices when she wears better shoes seems to do better.  No new symptoms of the ankle.    Patient also was having some signs of a lumbar radiculopathy. Patient was started on gabapentin, patient has done well with the osteopathic manipulation. 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 Not having any radicular symptoms. Patient states that has been feeling relatively well overall except for some increasing tightness. Seems very similar to what it was prior to the manipulation. Just worsening symptoms again..     Past Medical History:  Diagnosis Date  . Bradycardia   . Depression   . Palpitations    Past Surgical History:  Procedure Laterality Date  . TONSILLECTOMY     Social History   Social History  . Marital status: Married    Spouse name: N/A  . Number of children: 1  . 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 Smoking status: Former Smoker    Quit date: 11/16/2005  . Smokeless tobacco: Never Used  . Alcohol use No  . Drug use: No  . Sexual activity: Not on file   Other Topics Concern  . Not on file   Social History Narrative  . No narrative on file   Allergies  Allergen Reactions  . Augmentin [Amoxicillin-Pot Clavulanate] Hives   Family History  Problem Relation Age of Onset  . Depression Other   . Bipolar disorder Other   .  Hypertension Mother   . Aneurysm Neg Hx   . Polycystic kidney disease Neg Hx   . Hyperlipidemia Neg Hx   . Heart attack Neg Hx   . Diabetes Neg Hx     Past medical history, social, surgical and family history all reviewed in electronic medical record.  No pertanent information unless stated regarding to the chief complaint.  Review of Systems: No headache, visual changes, nausea, vomiting, diarrhea, constipation, dizziness, abdominal pain, skin rash, fevers, chills, night sweats, weight loss, swollen lymph nodes, body aches, joint swelling, muscle aches, chest pain, shortness of breath, mood changes.     Objective  There were no vitals taken for this visit.   Systems examined below as of 03/30/17 General: NAD A&O x3 mood, affect normal  HEENT: Pupils equal, extraocular movements intact no nystagmus Respiratory: not short of breath at rest or with speaking Cardiovascular: No lower extremity edema, non tender Skin: Warm dry intact with no signs of infection or rash on extremities or on axial skeleton. Abdomen: Soft nontender, no masses Neuro: Cranial nerves  intact, neurovascularly intact in all extremities with 2+ DTRs and 2+ pulses. Lymph: No lymphadenopathy appreciated today  Gait normal with good balance and coordination.  MSK: Non tender with full range of motion and good stability and symmetric strength and tone of shoulders, elbows, wrist,  knee hips and  ankles bilaterally.       Back Exam:  Inspection: Unremarkable  Motion: Flexion 45 deg, Extension 25 deg, Side Bending to 45 deg bilaterally,  Rotation to 45 deg bilaterally similar range of motion from previous exam SLR laying: Negative  XSLR laying: Negative  Palpable tenderness: Very mild discomfort noted in the thoracolumbar juncture as well as the lumbar sacral juncture mostly on the right side. FABER: Mild tightness on the right side. No radicular symptoms Sensory change: Gross sensation intact to all lumbar and  sacral dermatomes.  Reflexes: 2+ at both patellar tendons, 2+ at achilles tendons, Babinski's downgoing.  Strength at foot  Plantar-flexion: 5/5 Dorsi-flexion: 5/5 Eversion: 5/5 Inversion: 5/5  Leg strength  Quad: 5/5 Hamstring: 5/5 Hip flexor: 5/5 Hip abductors: 5/5  Gait unremarkable. .  Osteopathic findings T3 extended rotated and side bent right inhaled third rib T7 extended rotated and side bent left L2 flexed rotated and side bent right Sacrum right on right        Impression and Recommendations:     This case required medical decision making of moderate complexity.      Note: This dictation was prepared with Dragon dictation along with smaller phrase technology. Any transcriptional errors that result from this process are unintentional.

## 2017-03-31 ENCOUNTER — Ambulatory Visit: Payer: No Typology Code available for payment source | Admitting: Family Medicine

## 2017-04-27 ENCOUNTER — Ambulatory Visit (INDEPENDENT_AMBULATORY_CARE_PROVIDER_SITE_OTHER): Payer: No Typology Code available for payment source | Admitting: Family Medicine

## 2017-04-27 ENCOUNTER — Encounter: Payer: Self-pay | Admitting: Family Medicine

## 2017-04-27 VITALS — BP 113/75 | HR 79 | Resp 20 | Wt 169.0 lb

## 2017-04-27 DIAGNOSIS — N912 Amenorrhea, unspecified: Secondary | ICD-10-CM

## 2017-04-27 DIAGNOSIS — R5383 Other fatigue: Secondary | ICD-10-CM

## 2017-04-27 DIAGNOSIS — R110 Nausea: Secondary | ICD-10-CM

## 2017-04-27 DIAGNOSIS — R42 Dizziness and giddiness: Secondary | ICD-10-CM

## 2017-04-27 LAB — CBC WITH DIFFERENTIAL/PLATELET
BASOS ABS: 0 {cells}/uL (ref 0–200)
Basophils Relative: 0 %
Eosinophils Absolute: 140 cells/uL (ref 15–500)
Eosinophils Relative: 2 %
HEMATOCRIT: 42.1 % (ref 35.0–45.0)
Hemoglobin: 13.8 g/dL (ref 11.7–15.5)
LYMPHS PCT: 27 %
Lymphs Abs: 1890 cells/uL (ref 850–3900)
MCH: 30.1 pg (ref 27.0–33.0)
MCHC: 32.8 g/dL (ref 32.0–36.0)
MCV: 91.9 fL (ref 80.0–100.0)
MONO ABS: 420 {cells}/uL (ref 200–950)
MPV: 9.3 fL (ref 7.5–12.5)
Monocytes Relative: 6 %
NEUTROS PCT: 65 %
Neutro Abs: 4550 cells/uL (ref 1500–7800)
Platelets: 257 10*3/uL (ref 140–400)
RBC: 4.58 MIL/uL (ref 3.80–5.10)
RDW: 13.4 % (ref 11.0–15.0)
WBC: 7 10*3/uL (ref 3.8–10.8)

## 2017-04-27 LAB — POC URINALSYSI DIPSTICK (AUTOMATED)
Bilirubin, UA: NEGATIVE
Glucose, UA: NEGATIVE
KETONES UA: NEGATIVE
Leukocytes, UA: NEGATIVE
Nitrite, UA: NEGATIVE
PROTEIN UA: NEGATIVE
RBC UA: NEGATIVE
SPEC GRAV UA: 1.01 (ref 1.010–1.025)
UROBILINOGEN UA: 0.2 U/dL
pH, UA: 7 (ref 5.0–8.0)

## 2017-04-27 LAB — POCT URINE PREGNANCY: Preg Test, Ur: NEGATIVE

## 2017-04-27 MED ORDER — ONDANSETRON 4 MG PO TBDP
4.0000 mg | ORAL_TABLET | Freq: Three times a day (TID) | ORAL | 0 refills | Status: DC | PRN
Start: 2017-04-27 — End: 2017-05-18

## 2017-04-27 NOTE — Patient Instructions (Signed)
You look pretty good. Stay hydrated. Use zofran to avoid nausea and get nutrients back into your body. If worsening, or fever, chills, vomit or diarrhea would want to see back. Otherwise this hopefully will pass.

## 2017-04-27 NOTE — Progress Notes (Signed)
Autumn [name] , 05/05/1976, 41 y.o., female MRN: [id number] Patient Care Team    Relationship Specialty Notifications Start End  Ma Hillock, DO PCP - General Family Medicine  01/23/16     Chief Complaint  Patient presents with  . Nausea    x 1 week  . Dizziness     Subjective: Pt presents for an OV with complaints of nausea, dizziness, fatigue of 1 week duration.  Associated symptoms include amenorrhea. She reports she has an IUD that was just replaced in March. She does not perform string checks. She denies breast tenderness, vomit, abd pain ,dairrhea, fever, chills or rash. She denies insect bites. She has recently returned from [place]. She states she stayed on a resort. Did not drink any water that was off resort or not bottled. She did not take in any meals not on the resort. No one else is ill that was with her. She felt she made be dehydrated so she has been trying to drink more water, her appetite is decreased. She took a pregnancy test last week that was negative.   Depression screen [facility] 2/9 03/01/2017  Decreased Interest 0  Down, Depressed, Hopeless 0  PHQ - 2 Score 0    Allergies  Allergen Reactions  . Augmentin [Amoxicillin-Pot Clavulanate] Hives   Social History  Substance Use Topics  . Smoking status: Former Smoker    Quit date: 11/16/2005  . Smokeless tobacco: Never Used  . Alcohol use No   Past Medical History:  Diagnosis Date  . Bradycardia   . Depression   . Palpitations    Past Surgical History:  Procedure Laterality Date  . TONSILLECTOMY     Family History  Problem Relation Age of Onset  . Depression Other   . Bipolar disorder Other   . Hypertension Mother   . Aneurysm Neg Hx   . Polycystic kidney disease Neg Hx   . Hyperlipidemia Neg Hx   . Heart attack Neg Hx   . Diabetes Neg Hx    Allergies as of 04/27/2017      Reactions   Augmentin [amoxicillin-pot Clavulanate] Hives      Medication List       Accurate as of 04/27/17  3:08  PM. Always use your most recent med list.          albuterol 108 (90 Base) MCG/ACT inhaler Commonly known as:  PROVENTIL HFA;VENTOLIN HFA Inhale 2 puffs into the lungs every 6 (six) hours as needed for wheezing or shortness of breath.   ALPRAZolam 0.5 MG tablet Commonly known as:  XANAX Take one tablet by mouth Prior to flying.   EPINEPHrine 0.3 mg/0.3 mL Soaj injection Commonly known as:  EPI-PEN Inject 0.3 mLs (0.3 mg total) into the muscle once.   fluticasone 50 MCG/ACT nasal spray Commonly known as:  FLONASE PLACE 2 SPRAYS INTO BOTH NOSTRILS DAILY as needed   sertraline 100 MG tablet Commonly known as:  ZOLOFT Take 1 tablet (100 mg total) by mouth daily.   triamcinolone cream 0.1 % Commonly known as:  KENALOG Apply 1 application topically 2 (two) times daily.       All past medical history, surgical history, allergies, family history, immunizations andmedications were updated in the EMR today and reviewed under the history and medication portions of their EMR.     ROS: Negative, with the exception of above mentioned in HPI   Objective:  BP 113/75 (BP Location: Left Arm, Cuff Size: Normal)  Pulse 79   Resp 20   Wt 169 lb (76.7 kg)   SpO2 99%   BMI 29.01 kg/m  Body mass index is 29.01 kg/m. Gen: Afebrile. No acute distress. Nontoxic in appearance, well developed, well nourished. pleasant caucasian female.  HENT: AT. [place].  MMM Eyes:Pupils Equal Round Reactive to light, Extraocular movements intact,  Conjunctiva without redness, discharge or icterus. Neck/lymp/endocrine: Supple,no lymphadenopathy CV: RRR, noedema Chest: CTAB, no wheeze or crackles. Good air movement, normal resp effort.  Abd: Soft. flat. NTND. BS present. no Masses palpated. No rebound or guarding. Skin: no rashes, purpura or petechiae.  Neuro: Normal gait. PERLA. EOMi. Alert. Oriented x3  Psych: Normal affect, dress and demeanor. Normal speech. Normal thought content and judgment.  No exam  data present No results found. Results for orders placed or performed in visit on 04/27/17 (from the past 24 hour(s))  POCT Urinalysis Dipstick (Automated)     Status: None   Collection Time: 04/27/17  3:03 PM  Result Value Ref Range   Color, UA light yellow    Clarity, UA clear    Glucose, UA negative    Bilirubin, UA negative    Ketones, UA negative    Spec Grav, UA 1.010 1.010 - 1.025   Blood, UA negative    pH, UA 7.0 5.0 - 8.0   Protein, UA negative    Urobilinogen, UA 0.2 0.2 or 1.0 E.U./dL   Nitrite, UA negative    Leukocytes, UA Negative Negative  POCT urine pregnancy     Status: None   Collection Time: 04/27/17  3:05 PM  Result Value Ref Range   Preg Test, Ur Negative Negative    Assessment/Plan: Autumn [name] is a 41 y.o. female present for OV for  Fatigue, unspecified type Dizziness Nausea Amenorrhea - Urine studies normal today. Pt is afebrile. Exam is normal. Reassured this is likely a mild GI bug or possibly exposure to something from her vacation is possible. Currently without red flags. - Zofran for nausea, hydrate. Rest. Return precautions if not improved.  - POCT Urinalysis Dipstick (Automated) - CBC w/Diff - POCT urine pregnancy - F/U 1-2 weeks if worsening, fever, abd pain, dairrhea etc.    Reviewed expectations re: course of current medical issues.  Discussed self-management of symptoms.  Outlined signs and symptoms indicating need for more acute intervention.  Patient verbalized understanding and all questions were answered.  Patient received an After-Visit Summary.   Note is dictated utilizing voice recognition software. Although note has been proof read prior to signing, occasional typographical errors still can be missed. If any questions arise, please do not hesitate to call for verification.   electronically signed by:  Howard Pouch, DO  [place]

## 2017-04-28 ENCOUNTER — Telehealth: Payer: Self-pay | Admitting: Family Medicine

## 2017-04-28 ENCOUNTER — Ambulatory Visit: Payer: No Typology Code available for payment source | Admitting: Family Medicine

## 2017-04-28 NOTE — Telephone Encounter (Signed)
Spoke with patient reviewed lab results. 

## 2017-04-28 NOTE — Telephone Encounter (Signed)
Please call pt: her labs from yesterday are normal. No infection and blood counts are normal.

## 2017-05-18 ENCOUNTER — Ambulatory Visit: Payer: No Typology Code available for payment source | Admitting: Family Medicine

## 2017-05-18 ENCOUNTER — Ambulatory Visit (INDEPENDENT_AMBULATORY_CARE_PROVIDER_SITE_OTHER): Payer: No Typology Code available for payment source | Admitting: Family Medicine

## 2017-05-18 ENCOUNTER — Encounter: Payer: Self-pay | Admitting: Family Medicine

## 2017-05-18 VITALS — BP 106/73 | HR 72 | Temp 98.0°F | Resp 20 | Wt 171.2 lb

## 2017-05-18 DIAGNOSIS — R42 Dizziness and giddiness: Secondary | ICD-10-CM | POA: Diagnosis not present

## 2017-05-18 DIAGNOSIS — Z975 Presence of (intrauterine) contraceptive device: Secondary | ICD-10-CM | POA: Insufficient documentation

## 2017-05-18 NOTE — Progress Notes (Signed)
Autumn [name] , Oct 05, 1976, 41 y.o., female MRN: [id number] Patient Care Team    Relationship Specialty Notifications Start End  Ma Hillock, DO PCP - General Family Medicine  01/23/16     Chief Complaint  Patient presents with  . light headed    not improved from last visit     Subjective:  Lightheadedness:  Patient presents for follow-up on her light headedness today. She states that it is not any better from approximately 2 weeks ago when she was seen. She endorses feeling like she is going to pass out, not room spinning dizziness. She does feel the other constellation of symptoms of nausea, dizziness and fatigue are not as bad. However she does complain of muscle tension in her shoulder/neck area and ear fullness today. She is wondering if this could be connected to her lightheadedness. She denies fever, chills or unintentional weight loss. Last visit labs were collected with a normal CBC, normal urinalysis, negative pregnancy test. Patient has been hydrating, reports she is drinking 60-80 ounces of water a day. She is prescribed Zoloft 100 mg daily, this dose has been unchanged for quite a few years. She does have an IUD, that was placed in March, it is the same as her prior IUD. She denies any chest pain, palpitations, headaches or syncope. Lightheadedness occurs when standing up after sitting for long periods of time only. It occurs a few times a day. She has had chronic lightheadedness her entire life, however the last 3 weeks it has been worsening.  Prior note: Pt presents for an OV with complaints of nausea, dizziness, fatigue of 1 week duration.  Associated symptoms include amenorrhea. She reports she has an IUD that was just replaced in March. She does not perform string checks. She denies breast tenderness, vomit, abd pain ,dairrhea, fever, chills or rash. She denies insect bites. She has recently returned from [place]. She states she stayed on a resort. Did not drink any  water that was off resort or not bottled. She did not take in any meals not on the resort. No one else is ill that was with her. She felt she made be dehydrated so she has been trying to drink more water, her appetite is decreased. She took a pregnancy test last week that was negative.   Depression screen [facility] 2/9 03/01/2017  Decreased Interest 0  Down, Depressed, Hopeless 0  PHQ - 2 Score 0    Allergies  Allergen Reactions  . Augmentin [Amoxicillin-Pot Clavulanate] Hives   Social History  Substance Use Topics  . Smoking status: Former Smoker    Quit date: 11/16/2005  . Smokeless tobacco: Never Used  . Alcohol use No   Past Medical History:  Diagnosis Date  . Bradycardia   . Depression   . Palpitations    Past Surgical History:  Procedure Laterality Date  . TONSILLECTOMY     Family History  Problem Relation Age of Onset  . Depression Other   . Bipolar disorder Other   . Hypertension Mother   . Aneurysm Neg Hx   . Polycystic kidney disease Neg Hx   . Hyperlipidemia Neg Hx   . Heart attack Neg Hx   . Diabetes Neg Hx    Allergies as of 05/18/2017      Reactions   Augmentin [amoxicillin-pot Clavulanate] Hives      Medication List       Accurate as of 05/18/17  4:46 PM. Always use your most recent med  list.          albuterol 108 (90 Base) MCG/ACT inhaler Commonly known as:  PROVENTIL HFA;VENTOLIN HFA Inhale 2 puffs into the lungs every 6 (six) hours as needed for wheezing or shortness of breath.   ALPRAZolam 0.5 MG tablet Commonly known as:  XANAX Take one tablet by mouth Prior to flying.   EPINEPHrine 0.3 mg/0.3 mL Soaj injection Commonly known as:  EPI-PEN Inject 0.3 mLs (0.3 mg total) into the muscle once.   fluticasone 50 MCG/ACT nasal spray Commonly known as:  FLONASE PLACE 2 SPRAYS INTO BOTH NOSTRILS DAILY as needed   sertraline 100 MG tablet Commonly known as:  ZOLOFT Take 1 tablet (100 mg total) by mouth daily.       All past medical history,  surgical history, allergies, family history, immunizations andmedications were updated in the EMR today and reviewed under the history and medication portions of their EMR.     ROS: Negative, with the exception of above mentioned in HPI   Objective:  BP 106/73 (BP Location: Left Arm, Patient Position: Standing, Cuff Size: Normal)   Pulse 72   Temp 98 F (36.7 C)   Resp 20   Wt 171 lb 4 oz (77.7 kg)   SpO2 99%   BMI 29.39 kg/m  Body mass index is 29.39 kg/m.  Gen: Afebrile. No acute distress. Nontoxic in appearance, well-developed, well-nourished, Caucasian female. HENT: AT. [place]. Bilateral TM visualized and normal in appearance, mild fullness bilaterally. MMM. Bilateral nares are erythema, no drainage or swelling. Throat without erythema or exudates. No cough, no hoarseness, no postnasal drip. Eyes:Pupils Equal Round Reactive to light, Extraocular movements intact,  Conjunctiva without redness, discharge or icterus. Neck/lymp/endocrine: Supple, no lymphadenopathy, no thyromegaly CV: RRR no murmur, oh edema, +2/4 P posterior tibialis pulses Chest: CTAB, no wheeze or crackles Neuro:  Normal gait. PERLA. EOMi. Alert. Oriented x3  Psych: Mildly anxious, otherwise Normal affect, dress and demeanor. Normal speech. Normal thought content and judgment.    Assessment/Plan: Autumn [name] is a 41 y.o. female present for OV for follow up on  Lightheadedness: - Uncertain etiology for lightheadedness. She sounds like she is hydrating well. CBC, urinalysis were normal. Negative pregnancy test. Orthostatics negative today. She does have lower in blood pressures naturally. - She is on Zoloft, could consider hyponatremia/electrolytes. CMP ordered. - TSH. - May need to consider either anxiety as potential cause. - May need cardiology referral if labs continue to be normal. - Follow-up depending upon lab results.  Reviewed expectations re: course of current medical issues.  Discussed  self-management of symptoms.  Outlined signs and symptoms indicating need for more acute intervention.  Patient verbalized understanding and all questions were answered.  Patient received an After-Visit Summary.   Note is dictated utilizing voice recognition software. Although note has been proof read prior to signing, occasional typographical errors still can be missed. If any questions arise, please do not hesitate to call for verification.   electronically signed by:  Howard Pouch, DO  [place]

## 2017-05-18 NOTE — Patient Instructions (Signed)
We will do more work up to try to find cause , if unable could consider sending you to cardiology.    Dizziness Dizziness is a common problem. It makes you feel unsteady or light-headed. You may feel like you are about to pass out (faint). Dizziness can lead to getting hurt if you stumble or fall. Dizziness can be caused by many things, including:  Medicines.  Not having enough water in your body (dehydration).  Illness.  Follow these instructions at home: Eating and drinking  Drink enough fluid to keep your pee (urine) clear or pale yellow. This helps to keep you from getting dehydrated. Try to drink more clear fluids, such as water.  Do not drink alcohol.  Limit how much caffeine you drink or eat, if your doctor tells you to do that.  Limit how much salt (sodium) you drink or eat, if your doctor tells you to do that. Activity  Avoid making quick movements. ? When you stand up from sitting in a chair, steady yourself until you feel okay. ? In the morning, first sit up on the side of the bed. When you feel okay, stand slowly while you hold onto something. Do this until you know that your balance is fine.  If you need to stand in one place for a long time, move your legs often. Tighten and relax the muscles in your legs while you are standing.  Do not drive or use heavy machinery if you feel dizzy.  Avoid bending down if you feel dizzy. Place items in your home so you can reach them easily without leaning over. Lifestyle  Do not use any products that contain nicotine or tobacco, such as cigarettes and e-cigarettes. If you need help quitting, ask your doctor.  Try to lower your stress level. You can do this by using methods such as yoga or meditation. Talk with your doctor if you need help. General instructions  Watch your dizziness for any changes.  Take over-the-counter and prescription medicines only as told by your doctor. Talk with your doctor if you think that you are  dizzy because of a medicine that you are taking.  Tell a friend or a family member that you are feeling dizzy. If he or she notices any changes in your behavior, have this person call your doctor.  Keep all follow-up visits as told by your doctor. This is important. Contact a doctor if:  Your dizziness does not go away.  Your dizziness or light-headedness gets worse.  You feel sick to your stomach (nauseous).  You have trouble hearing.  You have new symptoms.  You are unsteady on your feet.  You feel like the room is spinning. Get help right away if:  You throw up (vomit) or have watery poop (diarrhea), and you cannot eat or drink anything.  You have trouble: ? Talking. ? Walking. ? Swallowing. ? Using your arms, hands, or legs.  You feel generally weak.  You are not thinking clearly, or you have trouble forming sentences. A friend or family member may notice this.  You have: ? Chest pain. ? Pain in your belly (abdomen). ? Shortness of breath. ? Sweating.  Your vision changes.  You are bleeding.  You have a very bad headache.  You have neck pain or a stiff neck.  You have a fever. These symptoms may be an emergency. Do not wait to see if the symptoms will go away. Get medical help right away. Call your local emergency  services (911 in the [place] Do not drive yourself to the hospital. Summary  Dizziness makes you feel unsteady or light-headed. You may feel like you are about to pass out (faint).  Drink enough fluid to keep your pee (urine) clear or pale yellow. Do not drink alcohol.  Avoid making quick movements if you feel dizzy.  Watch your dizziness for any changes. This information is not intended to replace advice given to you by your health care provider. Make sure you discuss any questions you have with your health care provider. Document Released: 10/22/2011 Document Revised: 11/19/2016 Document Reviewed: 11/19/2016 Elsevier Interactive Patient  Education  2017 [employer].

## 2017-05-19 LAB — COMPLETE METABOLIC PANEL WITH GFR
ALBUMIN: 4.6 g/dL (ref 3.6–5.1)
ALK PHOS: 42 U/L (ref 33–115)
ALT: 12 U/L (ref 6–29)
AST: 18 U/L (ref 10–30)
BILIRUBIN TOTAL: 0.6 mg/dL (ref 0.2–1.2)
BUN: 13 mg/dL (ref 7–25)
CALCIUM: 9.5 mg/dL (ref 8.6–10.2)
CO2: 26 mmol/L (ref 20–31)
Chloride: 103 mmol/L (ref 98–110)
Creat: 0.92 mg/dL (ref 0.50–1.10)
GFR, Est African American: >89 mL/min (ref >=60)
GFR, Est Non African American: 79 mL/min (ref >=60)
GLUCOSE: 90 mg/dL (ref 65–99)
Potassium: 4.4 mmol/L (ref 3.5–5.3)
SODIUM: 138 mmol/L (ref 135–146)
TOTAL PROTEIN: 7 g/dL (ref 6.1–8.1)

## 2017-05-19 LAB — TSH: TSH: 1.63 m[IU]/L

## 2017-05-20 ENCOUNTER — Encounter: Payer: Self-pay | Admitting: *Deleted

## 2017-05-20 ENCOUNTER — Telehealth: Payer: Self-pay | Admitting: Family Medicine

## 2017-05-20 MED ORDER — SERTRALINE HCL 100 MG PO TABS: 150.0000 mg | ORAL_TABLET | Freq: Every day | ORAL | 0 refills | Status: DC

## 2017-05-20 NOTE — Telephone Encounter (Signed)
Please have her start taking 1.5 tabs daily, I have called in a new prescription for the increased dose/amount of pills needed. There is no 150 mg pill, so she will continue take 1.5 tabs daily.

## 2017-05-20 NOTE — Telephone Encounter (Signed)
Spoke with patient reviewed lab results and information . Patient would like to try increase in medication and be referred to Neuro. She will follow up as directed after starting increase in medication. Patient verbalized understanding of all instructions.

## 2017-05-20 NOTE — Telephone Encounter (Signed)
Please call pt: - her labs are normal. Thyroid functioning normal, electrolytes, kidney and liver normal.  - We need to consider worsening anxiety as a potential cause, but also neurological/cardiac causes. If she feels anxiety is worsening we can increase zoloft and see her in 4 weeks after increase.  - Since she is not having chest pain or discomfort and it does not occur with activity I think neuro is the next step. ( we discussed both potential during her appt). I will place this referral today if agreeable.  Please advise

## 2017-05-20 NOTE — Telephone Encounter (Signed)
Left message with detailed instructions on patient voice mail and also sent in [facility] Chart.

## 2017-08-27 ENCOUNTER — Ambulatory Visit (INDEPENDENT_AMBULATORY_CARE_PROVIDER_SITE_OTHER): Payer: PRIVATE HEALTH INSURANCE | Admitting: Family Medicine

## 2017-08-27 ENCOUNTER — Encounter: Payer: Self-pay | Admitting: Family Medicine

## 2017-08-27 VITALS — BP 102/68 | HR 63 | Temp 98.7°F | Resp 20 | Ht 64.0 in | Wt 172.5 lb

## 2017-08-27 DIAGNOSIS — F341 Dysthymic disorder: Secondary | ICD-10-CM | POA: Diagnosis not present

## 2017-08-27 DIAGNOSIS — Z23 Encounter for immunization: Secondary | ICD-10-CM

## 2017-08-27 DIAGNOSIS — F40243 Fear of flying: Secondary | ICD-10-CM

## 2017-08-27 MED ORDER — ALPRAZOLAM 0.5 MG PO TABS: ORAL_TABLET | ORAL | 3 refills | Status: DC

## 2017-08-27 MED ORDER — SERTRALINE HCL 100 MG PO TABS: 150.0000 mg | ORAL_TABLET | Freq: Every day | ORAL | 1 refills | Status: DC

## 2017-08-27 NOTE — Progress Notes (Signed)
Autumn [name] , [birth year], 41 y.o., female MRN: [id number] Patient Care Team    Relationship Specialty Notifications Start End  Ma Hillock, DO PCP - General Family Medicine  01/23/16     Chief Complaint  Patient presents with  . Anxiety  . 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 Zoloft was increased from 100 mg to 150 mg QD. Patient reports she is doing well on current dose and would like to continue. She denies any side effects to medication. She is not sleeping as well, but she knows it os from anxiety. Her company is relocating to [place] [employer] and she either has to move or find a new job locally.   Prior note 03/01/2017:  Patient presents today for refills on xanax she uses for her fear of flying. 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 She is in [place] for [employer]. She has been prescribed Xanax in the past for her flight. She denies sedation on medication. She is requesting refills today Xanax 0.5 mg when necessary prior to flights. Patient is also prescribe Zoloft 100 mg daily, for depression and is doing well on this medication without side effects. She reports compliance with zoloft daily.  Depression screen [facility] 2/9 08/27/2017 03/01/2017  Decreased Interest 0 0  Down, Depressed, Hopeless 0 0  PHQ - 2 Score 0 0  Altered sleeping 2 -  Tired, decreased energy 0 -  Change in appetite 0 -  Feeling bad or failure about yourself  0 -  Trouble concentrating 0 -  Moving slowly or fidgety/restless 0 -  Suicidal thoughts 0 -  PHQ-9 Score 2 -  Difficult doing work/chores Not difficult at all -     Allergies  Allergen Reactions  . Augmentin [Amoxicillin-Pot Clavulanate] Hives   Social History  Substance Use Topics  . Smoking status: Former Smoker    Quit date: 11/16/2005  . Smokeless tobacco:  Never Used  . Alcohol use No   Past Medical History:  Diagnosis Date  . Bradycardia   . Depression   . Palpitations    Past Surgical History:  Procedure Laterality Date  . TONSILLECTOMY     Family History  Problem Relation Age of Onset  . Depression Other   . Bipolar disorder Other   . Hypertension Mother   . Aneurysm Neg Hx   . Polycystic kidney disease Neg Hx   . Hyperlipidemia Neg Hx   . Heart attack Neg Hx   . Diabetes Neg Hx    Social History   Social History  . Marital status: Married    Spouse name: N/A  . Number of children: 1  . 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 Smoking status: Former Smoker    Quit date: 11/16/2005  . Smokeless tobacco: Never Used  . Alcohol use No  . Drug use: No  . Sexual activity: Not on file   Other Topics Concern  . Not on file   Social History Narrative  . No narrative on file    Allergies as of 08/27/2017      Reactions   Augmentin [amoxicillin-pot Clavulanate] Hives      Medication List       Accurate as of 08/27/17  8:12 AM. Always use your most recent med list.          albuterol 108 (90 Base) MCG/ACT inhaler Commonly known as:  PROVENTIL HFA;VENTOLIN HFA Inhale 2 puffs into the lungs every 6 (six) hours as needed for wheezing or shortness of breath.   ALPRAZolam 0.5 MG tablet Commonly known as:  XANAX Take one tablet by mouth Prior to flying.   EPINEPHrine 0.3 mg/0.3 mL Soaj injection Commonly known as:  EPI-PEN Inject 0.3 mLs (0.3 mg total) into the muscle once.   fluticasone 50 MCG/ACT nasal spray Commonly known as:  FLONASE PLACE 2 SPRAYS INTO BOTH NOSTRILS DAILY as needed   sertraline 100 MG tablet Commonly known as:  ZOLOFT Take 1.5 tablets (150 mg total) by mouth daily.       No results found for this or any previous visit (from the past 24 hour(s)). No results found.   ROS: Negative, with the exception of above  mentioned in HPI  Objective:  BP 102/68 (BP Location: Right Arm, Patient Position: Sitting, Cuff Size: Normal)   Pulse 63   Temp 98.7 F (37.1 C)   Resp 20   Ht 5\' 4"  (1.626 m)   Wt 172 lb 8 oz (78.2 kg)   SpO2 98%   BMI 29.61 kg/m  Body mass index is 29.61 kg/m. Gen: Afebrile. No acute distress. Nontoxic in appearance. Pleasant female.  HENT: AT. [place]. MMM.  Eyes:Pupils Equal Round Reactive to light, Extraocular movements intact,  Conjunctiva without redness, discharge or icterus. CV: RRR  Chest: CTAB, no wheeze or crackles Neuro:  Normal gait. PERLA. EOMi. Alert. Oriented x3 Psych: Normal affect, dress and demeanor. Normal speech. Normal thought content and judgment.  Assessment/Plan: [name] is a 41 y.o. female present for acute OV for  Phobia, flying/DEPRESSION/ANXIETY - Stable. Continue  Zoloft 150 QD, refills provided today.  - She has tried melatonin and sleepy time tea, without benefit. Discussed potantial medication assistance (trazadone or vistaril), but she is going to give it a little bit more time and follow up on issue if needed.  - Controlled substance agreement 03/01/2017 - Xanax use only for phobia to flying. She is in understanding. [place] database reviewed today and made part of permenant chart. - f/u 6 mos.   electronically signed by:  Howard Pouch, DO  [place]

## 2017-08-27 NOTE — Patient Instructions (Signed)
It was nice to see you today.  I have refilled your medications as we discussed. AS long as you can continue to do well, follow up every 6 months on current issue and yearly with physicals.   Hope you have great Holidays!!!   Please help [place] help you:  We are honored you have chosen [facility] for your Primary Care home. Below you will find basic instructions that you may need to access in the future. Please help [place] help you by reading the instructions, which cover many of the frequent questions we experience.   Prescription refills and request:  -In order to allow more efficient response time, please call your pharmacy for all refills. They will forward the request electronically to [place]. This allows for the quickest possible response. Request left on a nurse line can take longer to refill, since these are checked as time allows between office patients and other phone calls.  - refill request can take up to 3-5 working days to complete.  - If request is sent electronically and request is appropiate, it is usually completed in 1-2 business days.  - all patients will need to be seen routinely for all chronic medical conditions requiring prescription medications (see follow-up below). If you are overdue for follow up on your condition, you will be asked to make an appointment and we will call in enough medication to cover you until your appointment (up to 30 days).  - all controlled substances will require a face to face visit to request/refill.  - if you desire your prescriptions to go through a new pharmacy, and have an active script at original pharmacy, you will need to call your pharmacy and have scripts transferred to new pharmacy. This is completed between the pharmacy locations and not by your provider.    Results: If any images or labs were ordered, it can take up to 1 week to get results depending on the test ordered and the lab/facility running and resulting the test. - Normal or  stable results, which do not need further discussion, may be released to your mychart immediately with attached note to you. A call may not be generated for normal results. Please make certain to sign up for mychart. If you have questions on how to activate your mychart you can call the front office.  - If your results need further discussion, our office will attempt to contact you via phone, and if unable to reach you after 2 attempts, we will release your abnormal result to your mychart with instructions.  - All results will be automatically released in mychart after 1 week.  - Your provider will provide you with explanation and instruction on all relevant material in your results. Please keep in mind, results and labs may appear confusing or abnormal to the untrained eye, but it does not mean they are actually abnormal for you personally. If you have any questions about your results that are not covered, or you desire more detailed explanation than what was provided, you should make an appointment with your provider to do so.   Our office handles many outgoing and incoming calls daily. If we have not contacted you within 1 week about your results, please check your mychart to see if there is a message first and if not, then contact our office.  In helping with this matter, you help decrease call volume, and therefore allow [place] to be able to respond to patients needs more efficiently.   Acute  office visits (sick visit):  An acute visit is intended for a new problem and are scheduled in shorter time slots to allow schedule openings for patients with new problems. This is the appropriate visit to discuss a new problem. In order to provide you with excellent quality medical care with proper time for you to explain your problem, have an exam and receive treatment with instructions, these appointments should be limited to one new problem per visit. If you experience a new problem, in which you desire to be  addressed, please make an acute office visit, we save openings on the schedule to accommodate you. Please do not save your new problem for any other type of visit, let us take care of it properly and quickly for you.   Follow up visits:  Depending on your condition(s) your provider will need to see you routinely in order to provide you with quality care and prescribe medication(s). Most chronic conditions (Example: hypertension, Diabetes, depression/anxiety... etc), require visits a couple times a year. Your provider will instruct you on proper follow up for your personal medical conditions and history. Please make certain to make follow up appointments for your condition as instructed. Failing to do so could result in lapse in your medication treatment/refills. If you request a refill, and are overdue to be seen on a condition, we will always provide you with a 30 day script (once) to allow you time to schedule.    Medicare wellness (well visit): - we have a wonderful Nurse Maudie Mercury), that will meet with you and provide you will yearly medicare wellness visits. These visits should occur yearly (can not be scheduled less than 1 calendar year apart) and cover preventive health, immunizations, advance directives and screenings you are entitled to yearly through your medicare benefits. Do not miss out on your entitled benefits, this is when medicare will pay for these benefits to be ordered for you.  These are strongly encouraged by your provider and is the appropriate type of visit to make certain you are up to date with all preventive health benefits. If you have not had your medicare wellness exam in the last 12 months, please make certain to schedule one by calling the office and schedule your medicare wellness with Maudie Mercury as soon as possible.   Yearly physical (well visit):  - Adults are recommended to be seen yearly for physicals. Check with your insurance and date of your last physical, most insurances  require one calendar year between physicals. Physicals include all preventive health topics, screenings, medical exam and labs that are appropriate for gender/age and history. You may have fasting labs needed at this visit. This is a well visit (not a sick visit), new problems should not be covered during this visit (see acute visit).  - Pediatric patients are seen more frequently when they are younger. Your provider will advise you on well child visit timing that is appropriate for your their age. - This is not a medicare wellness visit. Medicare wellness exams do not have an exam portion to the visit. Some medicare companies allow for a physical, some do not allow a yearly physical. If your medicare allows a yearly physical you can schedule the medicare wellness with our nurse Maudie Mercury and have your physical with your provider after, on the same day. Please check with insurance for your full benefits.   Late Policy/No Shows:  - all new patients should arrive 15-30 minutes earlier than appointment to allow [place] time  to  obtain all personal demographics,  insurance information and for you to complete office paperwork. - All established patients should arrive 10-15 minutes earlier than appointment time to update all information and be checked in .  - In our best efforts to run on time, if you are late for your appointment you will be asked to either reschedule or if able, we will work you back into the schedule. There will be a wait time to work you back in the schedule,  depending on availability.  - If you are unable to make it to your appointment as scheduled, please call 24 hours ahead of time to allow [place] to fill the time slot with someone else who needs to be seen. If you do not cancel your appointment ahead of time, you may be charged a no show fee.

## 2017-10-18 ENCOUNTER — Encounter (HOSPITAL_COMMUNITY): Payer: Self-pay | Admitting: Internal Medicine

## 2017-10-18 ENCOUNTER — Emergency Department (HOSPITAL_COMMUNITY)
Admission: EM | Admit: 2017-10-18 | Discharge: 2017-10-18 | Disposition: A | Discharge status: Home / Self Care | Payer: No Typology Code available for payment source | Attending: Emergency Medicine | Admitting: Emergency Medicine

## 2017-10-18 DIAGNOSIS — T7840XA Allergy, unspecified, initial encounter: Secondary | ICD-10-CM | POA: Diagnosis not present

## 2017-10-18 DIAGNOSIS — Z87891 Personal history of nicotine dependence: Secondary | ICD-10-CM | POA: Diagnosis not present

## 2017-10-18 DIAGNOSIS — Z79899 Other long term (current) drug therapy: Secondary | ICD-10-CM | POA: Insufficient documentation

## 2017-10-18 MED ORDER — PREDNISONE 10 MG PO TABS: 40.0000 mg | ORAL_TABLET | Freq: Every day | ORAL | 0 refills | Status: AC

## 2017-10-18 MED ORDER — FAMOTIDINE 20 MG PO TABS: 20.0000 mg | ORAL_TABLET | Freq: Two times a day (BID) | ORAL | 0 refills | Status: DC

## 2017-10-18 MED ORDER — EPINEPHRINE 0.3 MG/0.3ML IJ SOAJ: 0.3000 mg | Freq: Once | INTRAMUSCULAR | 0 refills | Status: AC

## 2017-10-18 MED ORDER — PREDNISONE 20 MG PO TABS
60.0000 mg | ORAL_TABLET | Freq: Once | ORAL | Status: AC
  Administered 2017-10-18: 60 mg via ORAL
  Filled 2017-10-18: qty 3

## 2017-10-18 MED ORDER — FAMOTIDINE 20 MG PO TABS
20.0000 mg | ORAL_TABLET | Freq: Once | ORAL | Status: AC
  Administered 2017-10-18: 20 mg via ORAL
  Filled 2017-10-18: qty 1

## 2017-10-18 NOTE — Discharge Instructions (Signed)
As discussed, take prednisone for the next 4 days and antihistamines. Follow up with your primary care provider for allergy skin testing.  Return to the Emergency department if symptoms worsen or new concerning symptoms in the meantime.

## 2017-10-18 NOTE — ED Provider Notes (Signed)
[facility] DEPT Provider Note   CSN: [id number] Arrival date & time: 10/18/17  1300     History   Chief Complaint Chief Complaint  Patient presents with  . Allergic Reaction    HPI Autumn [name] is a 41 y.o. female with no significant past medical history presenting with sudden onset of pruritus to her palms, tingling sensation on her head and facial swelling while jogging this morning.  She states that she did feel her throat narrowing in getting tight.  She also had hives to her upper extremities bilaterally now resolved. She called her husband who told her to call 911. She was given Benadryl 50 mg in route and significantly improved.  At this time she reports some edema to her hands bilaterally, but no chest pain, shortness of breath, improved facial swelling no narrowing sensation in her throat at this time. 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  She reports being prescribed an EpiPen a long time ago which is probably expired now.   HPI  Past Medical History:  Diagnosis Date  . Bradycardia   . Depression   . Palpitations     Patient Active Problem List   Diagnosis Date Noted  . Lightheadedness 05/18/2017  . IUD (intrauterine device) in place 05/18/2017  . Lumbar radiculopathy, right 01/11/2017  . Nonallopathic lesion of lumbosacral region 01/11/2017  . Nonallopathic lesion of sacral region 01/11/2017  . Nonallopathic lesion of thoracic region 01/11/2017  . Preventative health care 09/14/2014  . Seasonal allergies 03/07/2012  . DEPRESSION/ANXIETY 12/16/2010  . 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 TONSILLECTOMY      OB History    Gravida Para Term Preterm AB Living   1             SAB TAB Ectopic Multiple Live Births                   Home Medications    Prior to Admission medications   Medication Sig Start Date End Date Taking? Authorizing Provider    ALPRAZolam Duanne Moron) 0.5 MG tablet Take one tablet by mouth Prior to flying. Patient taking differently: Take 0.5 mg by mouth daily as needed for anxiety. Take one tablet by mouth Prior to flying. 08/27/17  Yes Kuneff, Renee A, DO  fluticasone (FLONASE) 50 MCG/ACT nasal spray PLACE 2 SPRAYS INTO BOTH NOSTRILS DAILY as needed for allergies 08/12/15  Yes [provider]  ibuprofen (ADVIL,MOTRIN) 200 MG tablet Take 400 mg by mouth every 6 (six) hours as needed for headache or moderate pain.   Yes [provider]  sertraline (ZOLOFT) 100 MG tablet Take 1.5 tablets (150 mg total) by mouth daily. 08/27/17  Yes Kuneff, Renee A, DO  albuterol (PROVENTIL HFA;VENTOLIN HFA) 108 (90 BASE) MCG/ACT inhaler Inhale 2 puffs into the lungs every 6 (six) hours as needed for wheezing or shortness of breath. Patient not taking: Reported on 10/18/2017 11/29/14   Saguier, Percell Miller, PA-C  EPINEPHrine 0.3 mg/0.3 mL IJ SOAJ injection Inject 0.3 mLs (0.3 mg total) into the muscle once for 1 dose. 10/18/17 10/18/17  Avie Echevaria B, PA-C  famotidine (PEPCID) 20 MG tablet Take 1 tablet (20 mg total) by mouth 2 (two) times daily for 7 days. 10/18/17 10/25/17  Avie Echevaria B, PA-C  predniSONE (DELTASONE) 10 MG tablet Take 4 tablets (40 mg total) by mouth daily for 4 days. 10/18/17 10/22/17  Avie Echevaria  B, PA-C    Family History Family History  Problem Relation Age of Onset  . Depression Other   . Bipolar disorder Other   . Hypertension Mother   . Aneurysm Neg Hx   . Polycystic kidney disease Neg Hx   . Hyperlipidemia Neg Hx   . Heart attack Neg Hx   . Diabetes Neg Hx     Social History Social History   Tobacco Use  . Smoking status: Former Smoker    Last attempt to quit: 11/16/2005    Years since quitting: 11.9  . Smokeless tobacco: Never Used  Substance Use Topics  . Alcohol use: No  . Drug use: No     Allergies   Augmentin [amoxicillin-pot clavulanate] and Erythromycin   Review of  Systems Review of Systems  Constitutional: Negative for chills, diaphoresis and fever.  HENT: Positive for facial swelling. Negative for ear pain, sore throat, tinnitus, trouble swallowing and voice change.   Eyes: Negative for pain, discharge, redness, itching and visual disturbance.  Respiratory: Negative for cough, choking, chest tightness, shortness of breath, wheezing and stridor.   Cardiovascular: Negative for chest pain and palpitations.  Gastrointestinal: Negative for abdominal pain, nausea and vomiting.  Musculoskeletal: Negative for arthralgias, back pain, myalgias and neck stiffness.  Skin: Negative for color change, pallor and rash.  Neurological: Positive for numbness. Negative for dizziness, seizures, syncope, weakness, light-headedness and headaches.       Experienced a tingling sensation to the top of her head at the time of onset of symptoms     Physical Exam Updated Vital Signs BP (!) 100/58   Pulse 68   Temp 97.8 F (36.6 C) (Oral)   Resp 18   Ht 5\' 4"  (1.626 m)   Wt 77.1 kg (170 lb)   SpO2 98%   BMI 29.18 kg/m   Physical Exam  Constitutional: She is oriented to person, place, and time. She appears well-developed and well-nourished. No distress.  HENT:  Head: Normocephalic and atraumatic.  Mouth/Throat: Oropharynx is clear and moist. No oropharyngeal exudate.  Eyes: Conjunctivae and EOM are normal. Right eye exhibits no discharge. Left eye exhibits no discharge. No scleral icterus.  Edema to upper eyelids bilaterally.  Facial swelling otherwise.  Patent airway  Neck: Normal range of motion. Neck supple.  Cardiovascular: Normal rate, regular rhythm and normal heart sounds.  No murmur heard. Pulmonary/Chest: Effort normal and breath sounds normal. No stridor. No respiratory distress. She has no wheezes. She has no rales.  Abdominal: Soft. She exhibits no distension. There is no tenderness.  Musculoskeletal: Normal range of motion. She exhibits no edema.    Neurological: She is alert and oriented to person, place, and time.  Skin: Skin is warm and dry. No rash noted. She is not diaphoretic. No erythema. No pallor.  Psychiatric: She has a normal mood and affect.  Nursing note and vitals reviewed.    ED Treatments / Results  Labs (all labs ordered are listed, but only abnormal results are displayed) Labs Reviewed - No data to display  EKG  EKG Interpretation None       Radiology No results found.  Procedures Procedures (including critical care time)  Medications Ordered in ED Medications  predniSONE (DELTASONE) tablet 60 mg (60 mg Oral Given 10/18/17 1426)  famotidine (PEPCID) tablet 20 mg (20 mg Oral Given 10/18/17 1426)     Initial Impression / Assessment and Plan / ED Course  I have reviewed the triage vital signs and the nursing  notes.  Pertinent labs & imaging results that were available during my care of the patient were reviewed by me and considered in my medical decision making (see chart for details).    Patient presenting with allergic reaction to unknown antigen.  She significantly improved from benadryl by EMS prior to arrival.   No airway compromise or facial swelling other than mild upper eyelid edema on my assessment.  Mild edema to hands bilaterally, removed wedding band using lubricant.  Patient given prednisone and pepcid and observed with no recurrence of symptoms. She improved in ED and was ready to go home after a period of observation. She reports feeling drowsy from benadryl, but otherwise well. Husband at bedside to take her home. Patient is well-appearing, non-toxic afebrile.  Will dc home with epipen, pepcid, prednisone and close PCP follow up for Allergy skin testing.  Discussed strict return precautions and advised to return to the emergency department if experiencing any new or worsening symptoms. Instructions were understood and patient agreed with discharge plan.   Final Clinical  Impressions(s) / ED Diagnoses   Final diagnoses:  Allergic reaction, initial encounter    ED Discharge Orders        Ordered    famotidine (PEPCID) 20 MG tablet  2 times daily     10/18/17 1524    predniSONE (DELTASONE) 10 MG tablet  Daily     10/18/17 1524    EPINEPHrine 0.3 mg/0.3 mL IJ SOAJ injection   Once     10/18/17 1524       Dossie Der 10/18/17 1533    Lacretia Leigh, MD 10/19/17 [phone]

## 2017-10-18 NOTE — ED Triage Notes (Signed)
Pt was on a jog this morning when she started feeling tingly and itchy on her hands and lightheaded. Pt reports having raised bumps on her arms and swelling on her face. Per EMS, pt was hypotensive on arrival, but no hives. Pt was given 50mg  benadryl and 155mL NS. Pt denies eating anything out of the ordinary and is only allergic to carrots.

## 2017-10-18 NOTE — ED Notes (Signed)
Bed: TI14 Expected date:  Expected time:  Means of arrival:  Comments: EMS-allergic reaction

## 2017-10-18 NOTE — ED Notes (Signed)
Pt reports feeling very lightheaded. Will notify [place], [place].

## 2017-10-21 ENCOUNTER — Ambulatory Visit: Payer: PRIVATE HEALTH INSURANCE | Admitting: Family Medicine

## 2017-10-21 ENCOUNTER — Encounter: Payer: Self-pay | Admitting: Family Medicine

## 2017-10-21 VITALS — BP 108/66 | HR 72 | Temp 97.4°F | Ht 64.0 in | Wt 175.0 lb

## 2017-10-21 DIAGNOSIS — Z91018 Allergy to other foods: Secondary | ICD-10-CM

## 2017-10-21 DIAGNOSIS — T782XXA Anaphylactic shock, unspecified, initial encounter: Secondary | ICD-10-CM | POA: Diagnosis not present

## 2017-10-21 MED ORDER — FAMOTIDINE 20 MG PO TABS: 20.0000 mg | ORAL_TABLET | Freq: Two times a day (BID) | ORAL | 0 refills | Status: DC

## 2017-10-21 NOTE — Patient Instructions (Signed)
I have placed an urgent referral for you to an allergist. They will call to schedule.  Continue to take pepcid (refilled) and daily allegra.  Carry epi pen at all times.

## 2017-10-21 NOTE — Progress Notes (Signed)
Autumn [name] , 12/09/1975, 41 y.o., female MRN: [id number] Patient Care Team    Relationship Specialty Notifications Start End  Ma Hillock, DO PCP - General Family Medicine  01/23/16     Chief Complaint  Patient presents with  . Allergic Reaction    seen in ED Monday     Subjective: Pt presents for an OV after having a severe allergic reaction while jogging. She has a known allergy to carrots that make her mouth tingle and is handling the vegetable she can get hives on her hand. Sometimes celery gives her mild reaction. She also endorses hives with exposure to cold. Three days ago, she had eaten leftovers which she had consumed the night before, and then went jogging. She had a reaction to the meal the night prior. During her job she started to notice hives forming on her hand and then tingling around her throat. Within a few minutes she felt her face swelling and she had difficulty breathing. She was able to call 911, EMS dispatched which took her to the emergency room. Her blood pressure did drop significantly in route to the emergency room. She was provided with Benadryl, Pepcid and an EpiPen. She is not certain how to use her epi pen. She has not had any additional allergic reactions since being discharged from the emergency room. However she is very hesitant to eat certain foods and/or exercise.  Depression screen [facility] 2/9 08/27/2017 03/01/2017  Decreased Interest 0 0  Down, Depressed, Hopeless 0 0  PHQ - 2 Score 0 0  Altered sleeping 2 -  Tired, decreased energy 0 -  Change in appetite 0 -  Feeling bad or failure about yourself  0 -  Trouble concentrating 0 -  Moving slowly or fidgety/restless 0 -  Suicidal thoughts 0 -  PHQ-9 Score 2 -  Difficult doing work/chores Not difficult at all -    Allergies  Allergen Reactions  . Augmentin [Amoxicillin-Pot Clavulanate] Hives    Has patient had a PCN reaction causing immediate rash, facial/tongue/throat swelling, SOB or  lightheadedness with hypotension: Y Has patient had a PCN reaction causing severe rash involving mucus membranes or skin necrosis: Y Has patient had a PCN reaction that required hospitalization: N Has patient had a PCN reaction occurring within the last 10 years: Y If all of the above answers are "NO", then may proceed with Cephalosporin use.   . Erythromycin Nausea And Vomiting   Social History   Tobacco Use  . Smoking status: Former Smoker    Last attempt to quit: 11/16/2005    Years since quitting: 11.9  . Smokeless tobacco: Never Used  Substance Use Topics  . Alcohol use: No   Past Medical History:  Diagnosis Date  . Allergy   . Bradycardia   . Depression   . Palpitations    Past Surgical History:  Procedure Laterality Date  . TONSILLECTOMY     Family History  Problem Relation Age of Onset  . Depression Other   . Bipolar disorder Other   . Hypertension Mother   . Aneurysm Neg Hx   . Polycystic kidney disease Neg Hx   . Hyperlipidemia Neg Hx   . Heart attack Neg Hx   . Diabetes Neg Hx    Allergies as of 10/21/2017      Reactions   Augmentin [amoxicillin-pot Clavulanate] Hives   Has patient had a PCN reaction causing immediate rash, facial/tongue/throat swelling, SOB or lightheadedness with hypotension: Y Has  patient had a PCN reaction causing severe rash involving mucus membranes or skin necrosis: Y Has patient had a PCN reaction that required hospitalization: N Has patient had a PCN reaction occurring within the last 10 years: Y If all of the above answers are "NO", then may proceed with Cephalosporin use.   Erythromycin Nausea And Vomiting      Medication List        Accurate as of 10/21/17  8:46 AM. Always use your most recent med list.          albuterol 108 (90 Base) MCG/ACT inhaler Commonly known as:  PROVENTIL HFA;VENTOLIN HFA Inhale 2 puffs into the lungs every 6 (six) hours as needed for wheezing or shortness of breath.   ALPRAZolam 0.5 MG  tablet Commonly known as:  XANAX Take one tablet by mouth Prior to flying.   EPINEPHrine 0.3 mg/0.3 mL Soaj injection Commonly known as:  EPI-PEN INJECT 0.3MLS INTO THE MUSCLE ONCE FOR 1 DOSE   famotidine 20 MG tablet Commonly known as:  PEPCID Take 1 tablet (20 mg total) by mouth 2 (two) times daily for 7 days.   fluticasone 50 MCG/ACT nasal spray Commonly known as:  FLONASE PLACE 2 SPRAYS INTO BOTH NOSTRILS DAILY as needed for allergies   ibuprofen 200 MG tablet Commonly known as:  ADVIL,MOTRIN Take 400 mg by mouth every 6 (six) hours as needed for headache or moderate pain.   predniSONE 10 MG tablet Commonly known as:  DELTASONE Take 4 tablets (40 mg total) by mouth daily for 4 days.   sertraline 100 MG tablet Commonly known as:  ZOLOFT Take 1.5 tablets (150 mg total) by mouth daily.       All past medical history, surgical history, allergies, family history, immunizations andmedications were updated in the EMR today and reviewed under the history and medication portions of their EMR.     ROS: Negative, with the exception of above mentioned in HPI   Objective:  BP 108/66 (BP Location: Right Arm, Patient Position: Sitting, Cuff Size: Normal)   Pulse 72   Temp (!) 97.4 F (36.3 C)   Ht 5\' 4"  (1.626 m)   Wt 175 lb (79.4 kg)   SpO2 100%   BMI 30.04 kg/m  Body mass index is 30.04 kg/m. Gen: Afebrile. No acute distress. Nontoxic in appearance, well developed, well nourished.  HENT: AT. [place]. MMM, no oral lesions. Bilateral nares mild erythema, no swelling or drainage. Throat without erythema or exudates. No cough, no hoarseness. Eyes:Pupils Equal Round Reactive to light, Extraocular movements intact,  Conjunctiva without redness, discharge or icterus. Neck/lymp/endocrine: Supple, no lymphadenopathy CV: RRR  Chest: CTAB, no wheeze or crackles. Good air movement, normal resp effort.  Skin: no rashes, purpura or petechiae.  Neuro: Normal gait. PERLA. EOMi. Alert.  Oriented x3  Psych: Normal affect, dress and demeanor. Normal speech. Normal thought content and judgment.  No exam data present No results found. No results found for this or any previous visit (from the past 24 hour(s)).  Assessment/Plan: Autumn [name] is a 41 y.o. female present for OV for  Anaphylaxis, initial encounter/Food Allergy - Anaphylaxis secondary to likely food allergy.  - EpiPen instructions as well as in person tutorial with her pen was completed today so that she understood how to administer medication in emergency. - Patient to start Allegra daily. Continue Pepcid daily. - Allergy cross reactivity chart provided to patient. Avoid known triggers of carrots, and all cross reactivity foods until formally tested  through allergist. - Ambulatory referral to Allergy>> urgently - famotidine (PEPCID) 20 MG tablet; Take 1 tablet (20 mg total) by mouth 2 (two) times daily.  Dispense: 180 tablet; Refill: 0 - Follow-up when necessary    Reviewed expectations re: course of current medical issues.  Discussed self-management of symptoms.  Outlined signs and symptoms indicating need for more acute intervention.  Patient verbalized understanding and all questions were answered.  Patient received an After-Visit Summary.    No orders of the defined types were placed in this encounter.    Note is dictated utilizing voice recognition software. Although note has been proof read prior to signing, occasional typographical errors still can be missed. If any questions arise, please do not hesitate to call for verification.   electronically signed by:  Howard Pouch, DO  [facility]

## 2017-11-30 ENCOUNTER — Ambulatory Visit: Payer: No Typology Code available for payment source | Admitting: Family Medicine

## 2017-11-30 ENCOUNTER — Encounter: Payer: Self-pay | Admitting: Family Medicine

## 2017-11-30 VITALS — BP 90/55 | HR 69 | Temp 98.1°F | Wt 178.4 lb

## 2017-11-30 DIAGNOSIS — F40243 Fear of flying: Secondary | ICD-10-CM | POA: Diagnosis not present

## 2017-11-30 DIAGNOSIS — G479 Sleep disorder, unspecified: Secondary | ICD-10-CM

## 2017-11-30 DIAGNOSIS — Z91018 Allergy to other foods: Secondary | ICD-10-CM

## 2017-11-30 DIAGNOSIS — F341 Dysthymic disorder: Secondary | ICD-10-CM | POA: Diagnosis not present

## 2017-11-30 MED ORDER — ALPRAZOLAM 0.5 MG PO TABS: 0.5000 mg | ORAL_TABLET | Freq: Every day | ORAL | 2 refills | Status: AC | PRN

## 2017-11-30 MED ORDER — SERTRALINE HCL 100 MG PO TABS: 200.0000 mg | ORAL_TABLET | Freq: Every day | ORAL | 1 refills | Status: AC

## 2017-11-30 MED ORDER — FAMOTIDINE 20 MG PO TABS: 20.0000 mg | ORAL_TABLET | Freq: Two times a day (BID) | ORAL | 0 refills | Status: DC

## 2017-11-30 NOTE — Patient Instructions (Signed)
I have refilled you medications today.  Increase your zoloft to 200 mg a day (2 tabs( I have refilled your xanax as well.    Give the difficulty sleeping a few weeks at increased zoloft dose. If not improved follow up and we can consider a different regimen.

## 2017-11-30 NOTE — Progress Notes (Signed)
Autumn [name] , [birth year], 42 y.o., female MRN: [id number] Patient Care Team    Relationship Specialty Notifications Start End  Ma Hillock, DO PCP - General Family Medicine  01/23/16     Chief Complaint  Patient presents with  . 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 She feels this has increased her anxiety because exercising was an outlet for her. She has had trouble falling asleep. When she does sleep she feels it is more sound. She has 3 long airplane rides [place] scheduled and has fear of flying. She has used xnanax in the past that worked for her. Prior note: Pt had been experiencing lightlessness, felt to be secondary to anxiety given normal work up. Zoloft was increased from 100 mg to 150 mg QD. Patient reports she is doing well on current dose and would like to continue. She denies any side effects to medication. She is not sleeping as well, but she knows it os from anxiety. Her company is relocating to [facility] and she either has to move or find a new job locally.   Prior note 03/01/2017:  Patient presents today for refills on xanax she uses for her fear of flying. 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 She is in [place] for [employer]. She has been prescribed Xanax in the past for her flight. She denies sedation on medication. She is requesting refills today Xanax 0.5 mg when necessary prior to flights. Patient is also prescribe Zoloft 100 mg daily, for depression and is doing well on this medication without side effects. She reports compliance with zoloft daily.  Depression screen [facility] 2/9 11/30/2017 08/27/2017 03/01/2017  Decreased Interest 0 0 0  Down, Depressed, Hopeless 0 0 0  PHQ - 2 Score 0 0 0  Altered sleeping 0 2 -    Tired, decreased energy 0 0 -  Change in appetite 0 0 -  Feeling bad or failure about yourself  0 0 -  Trouble concentrating 0 0 -  Moving slowly or fidgety/restless 0 0 -  Suicidal thoughts 0 0 -  PHQ-9 Score 0 2 -  Difficult doing work/chores Not difficult at all Not difficult at all -   GAD 7 : Generalized Anxiety Score 11/30/2017  Nervous, Anxious, on Edge 0  Control/stop worrying 0  Worry too much - different things 0  Trouble relaxing 3  Restless 1  Easily annoyed or irritable 0  Afraid - awful might happen 1  Total GAD 7 Score 5  Anxiety Difficulty Not difficult at all      Allergies  Allergen Reactions  . 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 Celery Oil     Celery allergy  . Augmentin [Amoxicillin-Pot Clavulanate] Hives    Has patient had a PCN reaction causing immediate rash, facial/tongue/throat swelling, SOB or lightheadedness with hypotension: Y Has patient had a PCN reaction causing severe rash involving mucus membranes or skin necrosis: Y Has patient had a PCN reaction that required hospitalization: N Has patient had a PCN reaction occurring within the last 10 years: Y If all of the above answers are "NO", then may proceed with Cephalosporin use.   . Erythromycin Nausea And Vomiting   Social History  Tobacco Use  . Smoking status: Former Smoker    Last attempt to quit: 11/16/2005    Years since quitting: 12.0  . Smokeless tobacco: Never Used  Substance Use Topics  . Alcohol use: No   Past Medical History:  Diagnosis Date  . Allergy   . Bradycardia   . Depression   . Palpitations    Past Surgical History:  Procedure Laterality Date  . TONSILLECTOMY     Family History  Problem Relation Age of Onset  . Depression Other   . Bipolar disorder Other   . Hypertension Mother   . Aneurysm Neg Hx   . Polycystic kidney disease Neg Hx   . Hyperlipidemia Neg Hx   . Heart attack Neg Hx   . Diabetes Neg Hx    Social History   Socioeconomic History   . Marital status: Married    Spouse name: Not on file  . Number of children: 1  . Years of education: Not on file  . Highest education level: Not on file  Social Needs  . Financial resource strain: Not on file  . Food insecurity - worry: Not on file  . Food insecurity - inability: Not on file  . Transportation needs - medical: Not on file  . Transportation needs - non-medical: Not on file  Occupational History  . Occupation: Publishing rights manager: [employer]  Tobacco Use  . Smoking status: Former Smoker    Last attempt to quit: 11/16/2005    Years since quitting: 12.0  . Smokeless tobacco: Never Used  Substance and Sexual Activity  . Alcohol use: No  . Drug use: No  . Sexual activity: Not on file  Other Topics Concern  . Not on file  Social History Narrative  . Not on file    Allergies as of 11/30/2017      Reactions   Carrot [daucus Carota] Anaphylaxis   Celery Oil    Celery allergy   Augmentin [amoxicillin-pot Clavulanate] Hives   Has patient had a PCN reaction causing immediate rash, facial/tongue/throat swelling, SOB or lightheadedness with hypotension: Y Has patient had a PCN reaction causing severe rash involving mucus membranes or skin necrosis: Y Has patient had a PCN reaction that required hospitalization: N Has patient had a PCN reaction occurring within the last 10 years: Y If all of the above answers are "NO", then may proceed with Cephalosporin use.   Erythromycin Nausea And Vomiting      Medication List        Accurate as of 11/30/17  8:06 AM. Always use your most recent med list.          albuterol 108 (90 Base) MCG/ACT inhaler Commonly known as:  PROVENTIL HFA;VENTOLIN HFA Inhale 2 puffs into the lungs every 6 (six) hours as needed for wheezing or shortness of breath.   ALPRAZolam 0.5 MG tablet Commonly known as:  XANAX Take one tablet by mouth Prior to flying.   EPINEPHrine 0.3 mg/0.3 mL Soaj injection Commonly known as:  EPI-PEN INJECT  0.3MLS INTO THE MUSCLE ONCE FOR 1 DOSE   famotidine 20 MG tablet Commonly known as:  PEPCID Take 1 tablet (20 mg total) by mouth 2 (two) times daily.   fluticasone 50 MCG/ACT nasal spray Commonly known as:  FLONASE PLACE 2 SPRAYS INTO BOTH NOSTRILS DAILY as needed for allergies   ibuprofen 200 MG tablet Commonly known as:  ADVIL,MOTRIN Take 400 mg by mouth every 6 (six) hours as needed  for headache or moderate pain.   sertraline 100 MG tablet Commonly known as:  ZOLOFT Take 1.5 tablets (150 mg total) by mouth daily.       No results found for this or any previous visit (from the past 24 hour(s)). No results found.   ROS: Negative, with the exception of above mentioned in HPI  Objective:  BP (!) 90/55 (BP Location: Left Arm, Patient Position: Sitting, Cuff Size: Normal)   Pulse 69   Temp 98.1 F (36.7 C) (Oral)   Wt 178 lb 6.4 oz (80.9 kg)   SpO2 97%   BMI 30.62 kg/m  Body mass index is 30.62 kg/m. Gen: Afebrile. No acute distress.  HENT: AT. [place].  MMM. Psych: Normal affect, dress and demeanor. Normal speech. Normal thought content and judgment.   Assessment/Plan: AVA TANGNEY is a 42 y.o. female present for acute OV for  Phobia, flying/DEPRESSION/ANXIETY/sleep disturbance - Could use more coverage. Increase zoloft to 200 mg a day.  - She has tried melatonin and sleepy time tea, without benefit. Discussed potantial medication assistance (trazadone, doxepin or vistaril), but she is going to give it a little bit more time and follow up on issue if needed. The later two may be beneficial to allergy condition. If increased zoloft not helpful, she we make follow up in 1-2 months and consider adding new med - Controlled substance agreement 03/01/2017 - Xanax use only for phobia to flying. She is in understanding. [place] database reviewed today and made part of permenant chart.  - f/u 6 mos. Sooner if increase zoloft is not helpful for sleep.   electronically signed  by:  Howard Pouch, DO  [place]

## 2017-12-14 IMAGING — DX DG LUMBAR SPINE COMPLETE 4+V
5 series · 5 of 5 positions shown · non-contrast
Comparison: None.

CLINICAL DATA: Lower back pain intermittent x 8-9 mos, pain
radiates to rt lower leg, NKI.

EXAM:
LUMBAR SPINE - COMPLETE 4+ VIEW

[l-spine ap]
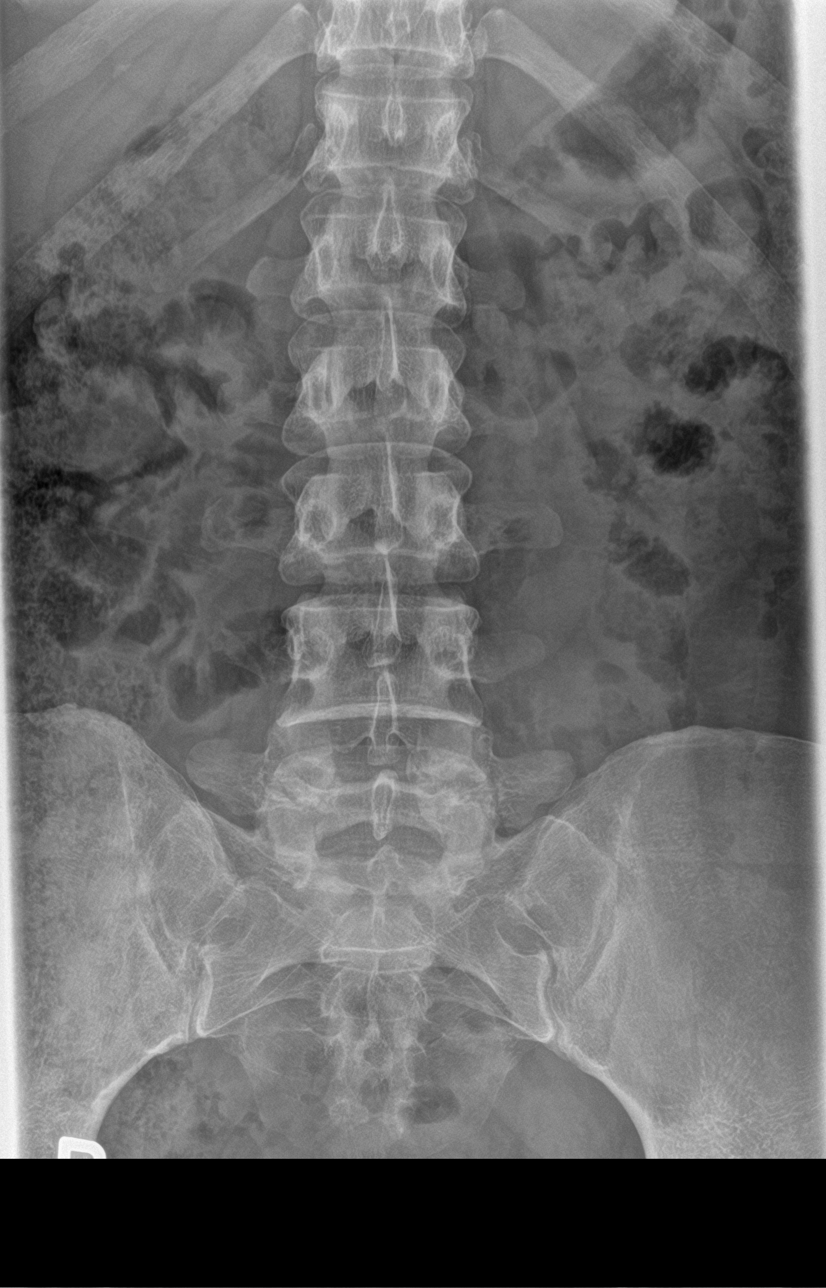

[l-spine obl (1 of 2)]
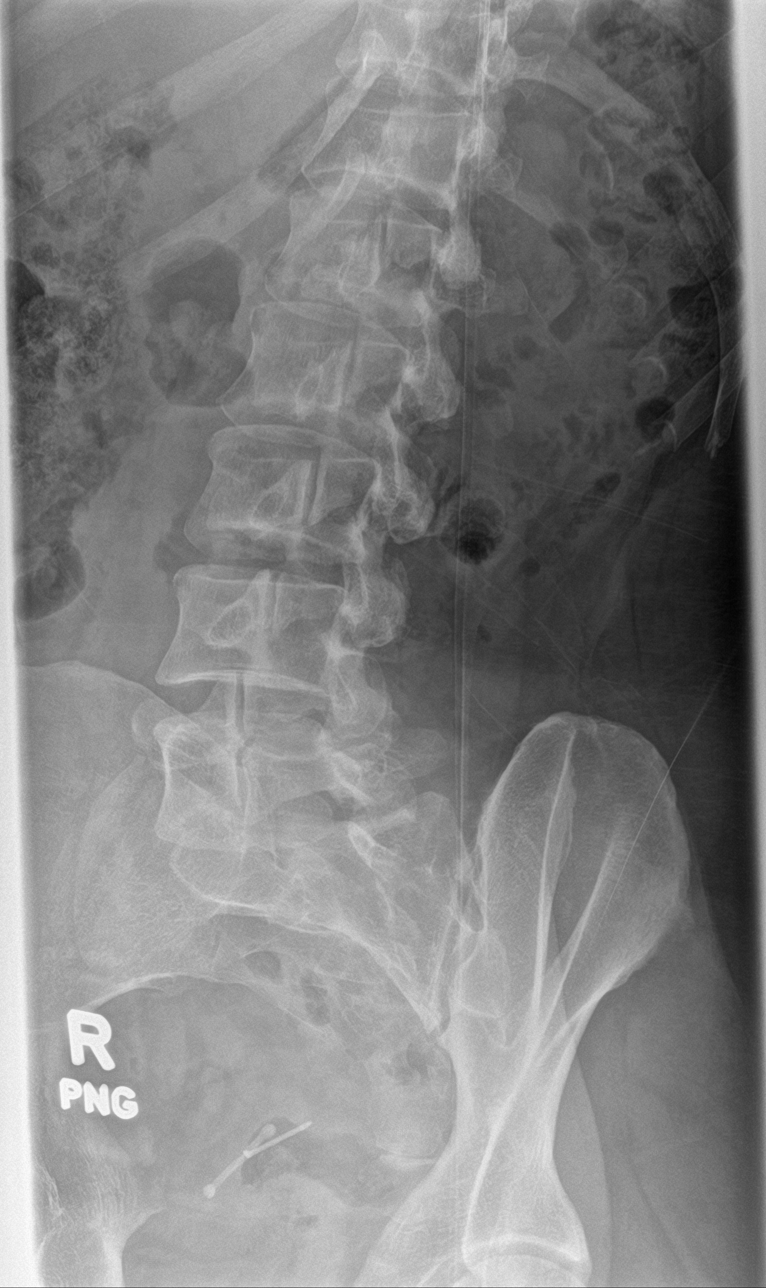

[l-spine obl (2 of 2)]
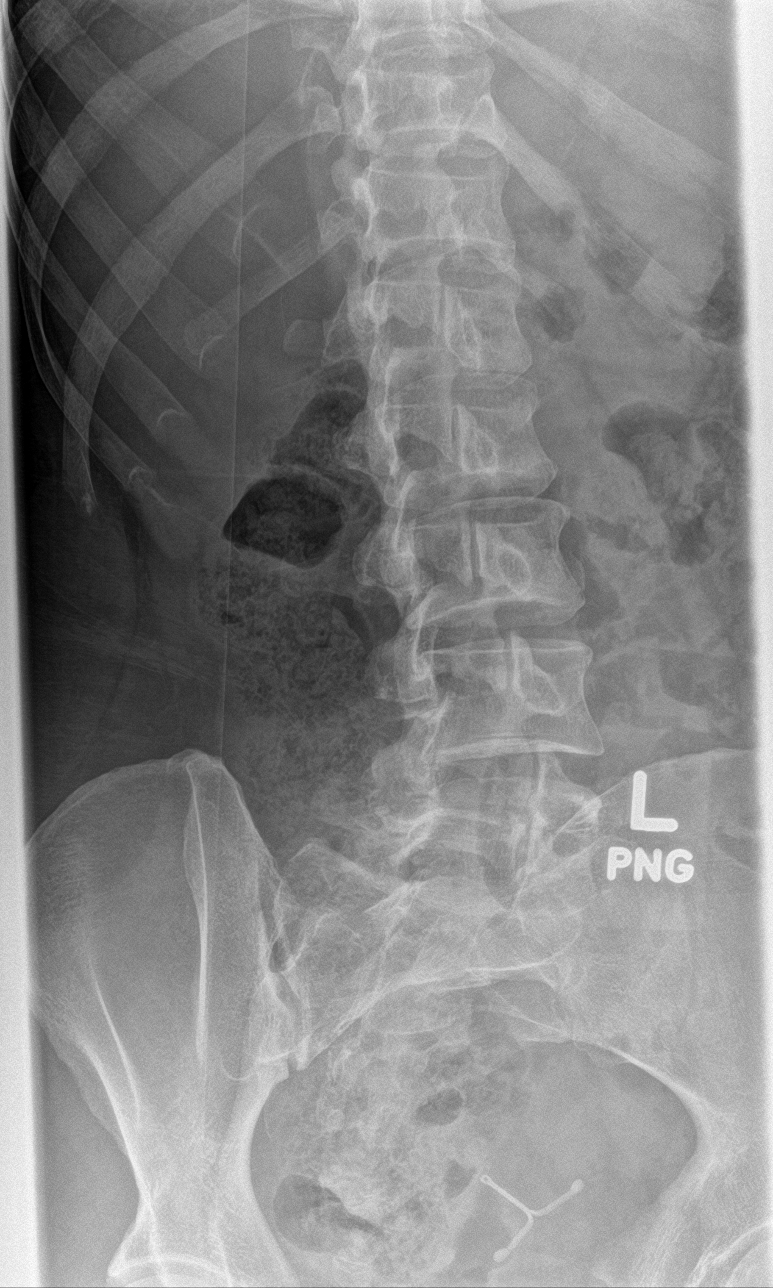

[l-spine lat]
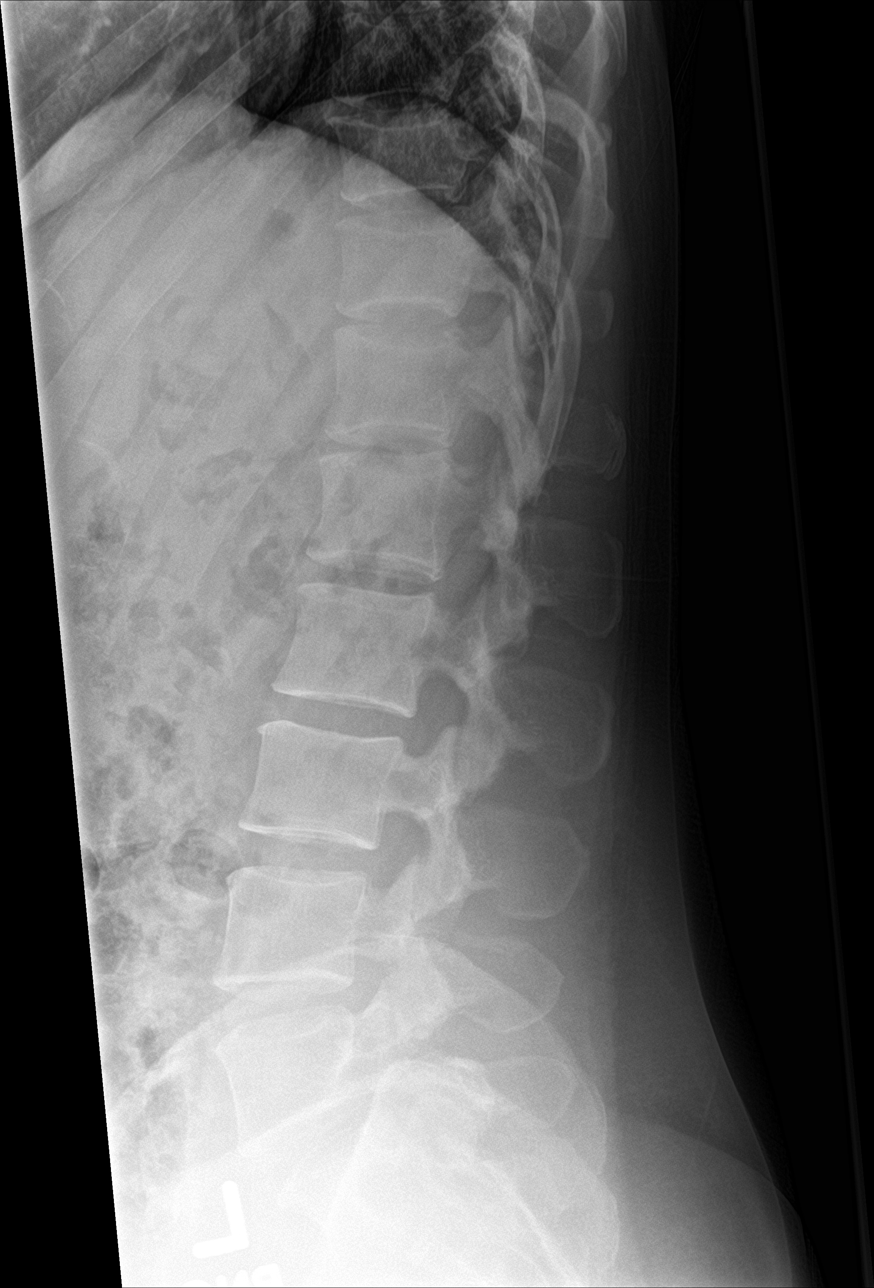

[l-spine spot]
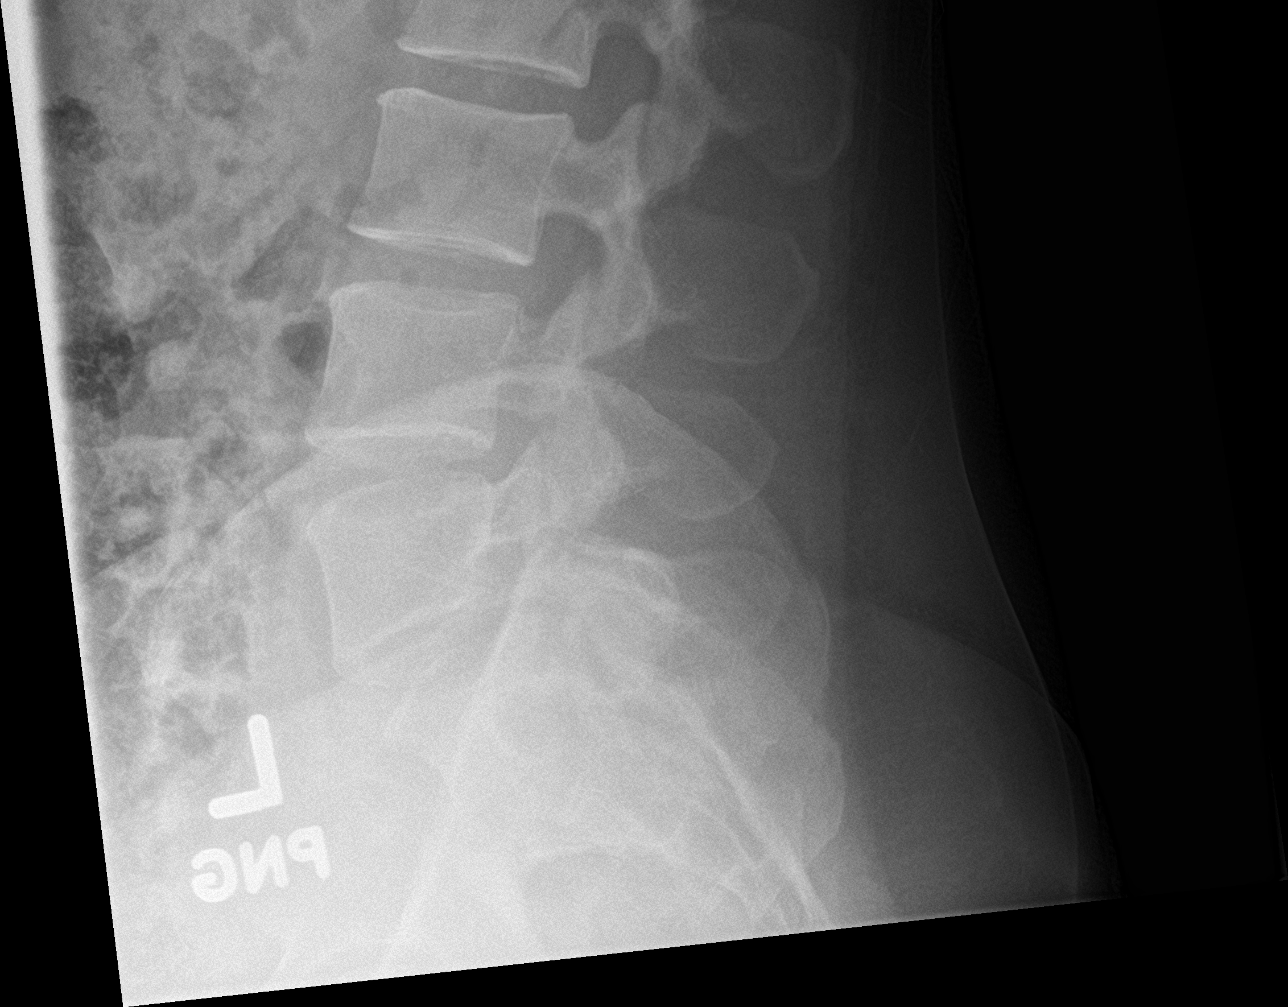

[5 of 5 positions shown; findings below may reference images not displayed]

FINDINGS: Bilateral L5 pars defects without anterolisthesis. There is no
evidence of acute lumbar spine fracture. Alignment is normal.
Intervertebral disc spaces are maintained. IUD projects in expected
location
IMPRESSION: 1. Negative for acute abnormality.
2. Bilateral L5 pars defects without anterolisthesis.

## 2018-04-13 ENCOUNTER — Other Ambulatory Visit: Payer: Self-pay

## 2018-04-13 DIAGNOSIS — Z91018 Allergy to other foods: Secondary | ICD-10-CM

## 2018-04-13 MED ORDER — FAMOTIDINE 20 MG PO TABS
20.0000 mg | ORAL_TABLET | Freq: Two times a day (BID) | ORAL | 0 refills | Status: AC
Start: 2018-04-13 — End: 2018-07-12

## 2018-04-13 MED ORDER — FAMOTIDINE 20 MG PO TABS: 20.0000 mg | ORAL_TABLET | Freq: Two times a day (BID) | ORAL | 0 refills | Status: DC

## 2018-10-05 ENCOUNTER — Encounter: Payer: Self-pay | Admitting: *Deleted
